# Patient Record
Sex: Male | Born: 1980 | Race: Black or African American | Hispanic: No | Marital: Married | Smoking: Never smoker
Health system: Southern US, Community
[De-identification: ages and names within clinical notes are randomized; demographics above are authoritative.]

## PROBLEM LIST (undated history)

## (undated) DIAGNOSIS — E119 Type 2 diabetes mellitus without complications: Secondary | ICD-10-CM

## (undated) DIAGNOSIS — G114 Hereditary spastic paraplegia: Secondary | ICD-10-CM

## (undated) HISTORY — DX: Type 2 diabetes mellitus without complications: E11.9

## (undated) HISTORY — PX: CARDIAC SURGERY: SHX584

---

## 2018-07-16 ENCOUNTER — Other Ambulatory Visit: Payer: Self-pay | Admitting: Internal Medicine

## 2018-07-16 DIAGNOSIS — R270 Ataxia, unspecified: Secondary | ICD-10-CM

## 2018-07-24 ENCOUNTER — Ambulatory Visit
Admission: RE | Admit: 2018-07-24 | Discharge: 2018-07-24 | Disposition: A | Discharge status: Home / Self Care | Payer: BLUE CROSS/BLUE SHIELD | Source: Ambulatory Visit | Attending: Internal Medicine | Admitting: Internal Medicine

## 2018-07-24 ENCOUNTER — Other Ambulatory Visit: Payer: Self-pay

## 2018-07-24 DIAGNOSIS — R270 Ataxia, unspecified: Secondary | ICD-10-CM | POA: Insufficient documentation

## 2018-07-24 MED ORDER — GADOBUTROL 1 MMOL/ML IV SOLN
10.0000 mL | Freq: Once | INTRAVENOUS | Status: AC | PRN
  Administered 2018-07-24: 10 mL via INTRAVENOUS

## 2019-01-27 ENCOUNTER — Ambulatory Visit: Payer: BLUE CROSS/BLUE SHIELD | Attending: Neurology

## 2019-01-27 ENCOUNTER — Other Ambulatory Visit: Payer: Self-pay

## 2019-01-27 DIAGNOSIS — G8929 Other chronic pain: Secondary | ICD-10-CM | POA: Diagnosis present

## 2019-01-27 DIAGNOSIS — R2681 Unsteadiness on feet: Secondary | ICD-10-CM | POA: Insufficient documentation

## 2019-01-27 DIAGNOSIS — M25511 Pain in right shoulder: Secondary | ICD-10-CM | POA: Insufficient documentation

## 2019-01-27 NOTE — Therapy (Addendum)
[place] [facility] MAIN [facility] SERVICES [address] [place], [place], [address] Phone: [phone]   Fax:  [phone]   Physical Therapy Evaluation  Patient Details  Name: Danny Cooper MRN: [id number] Date of Birth: 03/25/1981 Referring Provider (PT): Dr. Sherryll Burger   Encounter Date: 01/27/2019  PT End of Session - 01/28/19 0819    Visit Number  1    Number of Visits  25    Date for PT Re-Evaluation  04/22/19    Authorization Type  eval 01/28/19    PT Start Time  1015    PT Stop Time  1115    PT Time Calculation (min)  60 min    Equipment Utilized During Treatment  Gait belt    Activity Tolerance  Patient tolerated treatment well    Behavior During Therapy  [facility] for tasks assessed/performed       History reviewed. No pertinent past medical history.  History reviewed. No pertinent surgical history.  There were no vitals filed for this visit.   Subjective Assessment - 01/27/19 1024    Subjective  Imbalance, ataxia, R shoulder pain, and L flank pain    Pertinent History  Pt is here for imbalance, R shoulder pain, and L flank pain. 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 He has been having imbalance for multiple years but has never had any therapy for this issue. He ambulates using a cane or walking stick in his RUE. He also has a rolling walker but doesn't use it. He endorses 2-3 falls in the last 6 months which tipically occur laterally or as a result of tripping over his feet. In addition he also has been having R shoulder pain for the last 2-3 months. He believes that the pain might have started after he fell on his R shoulder but he is unsure. He is also complaining of L flank pain today but this was not discussed in depth. He was recently started on Chantix for the ataxia. Pt reports that he also has a history of sleep apnea. Denies red flag symptoms.    Limitations  Walking    How long can you  walk comfortably?  No limitations    Diagnostic tests  MRI, see history in evaluation    Patient Stated Goals  Improve balance, relieve L rib pain, decrease numbness/tingling in legs, improve quality of life    Currently in Pain?  No/denies    Pain Score  0-No pain   Best: 0/10, Worst: 5/10   Pain Location  Shoulder    Pain Orientation  Right    Pain Descriptors / Indicators  Tightness;Aching    Pain Type  Chronic pain    Pain Radiating Towards  None, pain is on superior aspect of R shoulder    Pain Onset  More than a month ago    Pain Frequency  Intermittent    Aggravating Factors   lifting son, no problems with reaching overhead    Pain Relieving Factors  assage, Aleve possibly, has not tried ice or head    Multiple Pain Sites  --   Complains of L flank pain but not discussed in depth today         SUBJECTIVE Onset: Pt is here for imbalance, R shoulder pain, and L flank pain. 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 He has been having imbalance for multiple years but has never had any therapy for this  issue. He ambulates using a cane or walking stick in his RUE. He also has a rolling walker but doesn't use it. He endorses 2-3 falls in the last 6 months which tipically occur laterally or as a result of tripping over his feet. In addition he also has been having R shoulder pain for the last 2-3 months. He believes that the pain might have started after he fell on his R shoulder but he is unsure. He is also complaining of L flank pain today but this was not discussed in depth. He was recently started on Chantix for the ataxia. Pt reports that he also has a history of sleep apnea. Denies red flag symptoms. Shoulder trauma: Yes Pt is unsure but thinks he may have fallen on the shoulder.  Pain quality: Aching and tightness Pain: 0/10 Present, 0/10 Best, 5/10 Worst: Aggravating factors: lifting son, no problems with reaching  overhead Easing factors: Massage, Aleve possibly, has not tried ice or head 24 hour pain behavior: More in the AM to noon, improves with movement Radiating pain: No Numbness/Tingling: Yes, no numbness/tingling in RUE, Hx of bilateral anterior thigh and leg numbness. Tingling in plantar surface of both feet Prior history of shoulder injury or pain: No Prior history of therapy for shoulder: No Dominant hand: Right Imaging: No history of R shoulder imaging. Brain MRI 07/24/18: No focal or acute finding. The brain does show generalized volume loss, more than expected at this age. Recent changes in overall health/medication: Pt just started Chantix last month for the ataxia. Was given neurological diagnosis of ARSACS in April per patient report.  Falls: 2-3 falls in the last 6 months. Directional pattern for falls: Sideways, Pt reports that he tends to trip over his feet Prior history of physical therapy: no history of therapy for imbalance or R shoulder pain Follow-up appointment with MD: PCP December 3rd, 2020.  Red flags (bowel/bladder changes, saddle paresthesia, personal history of cancer, chills/fever, night sweats, unrelenting pain) Negative   OBJECTIVE  MUSCULOSKELETAL: Tremor: Absent Bulk: Normal Tone: Normal, no clonus  Posture Rounded shoulders and forward head  Gait Ataxic and unsteady gait with walking stick in RUE. Wide BOS and frequent listing.   Strength R/L 4+/4+ Shoulder flexion (anterior deltoid/pec major/coracobrachialis, axillary n. (C5-6) and musculocutaneous n. (C5-7)) 5/5 Shoulder abduction (deltoid/supraspinatus, axillary/suprascapular n, C5) 4/4 Shoulder external rotation (infraspinatus/teres minor) 5/5 Shoulder internal rotation (subcapularis/lats/pec major) 5/5 Elbow flexion (biceps brachii, brachialis, brachioradialis, musculoskeletal n, C5-6) 5/5 Elbow extension (triceps, radial n, C7) 5/5 Wrist Extension 5/5 Wrist Flexion 5/5 Finger adduction  (interossei, ulnar n, T1) 4-/4- Hip flexion 4-/4- Hip external rotation 4-/4- Hip internal rotation 4-/4- Hip abduction (tested in sittng) 4-/4- Hip adduction (tested in sitting) 5/5 Knee extension 4-/4 Knee flexion 3/3 Ankle Plantarflexion 4/4 Ankle Dorsiflexion   NEUROLOGICAL:  Mental Status Patient is oriented to person, place and time.  Recent memory is intact.  Remote memory is intact.  Attention span and concentration are intact.  Expressive speech is intact.  Patient's fund of knowledge is within normal limits for educational level.  Cranial Nerves Visual acuity and visual fields are intact  Severely limited vertical gaze bilaterally. Horizontal smooth pursuits are extremely saccadic Facial sensation is intact bilaterally  Hearing is normal as tested by gross conversation Shoulder shrug strength is intact   Sensation Grossly intact to light touch bilateral UEs/LEs as determined by testing dermatomes C2-T2/L2-S2 respectively Proprioception and hot/cold testing deferred on this date  Reflexes Deferred testing  Coordination/Cerebellar Finger to Nose:  Abnormal bilaterally Heel to Shin: Abnormal bilaterally Rapid alternating movements: Abnormal bilaterally Finger Opposition: WNL Pronator Drift: Negative Finger to chin: Abnormal bilaterally   FUNCTIONAL OUTCOME MEASURES   Results Comments  QuickDASH 20.5% R shoulder  BERG 44/56 Fall risk, in need of intervention  DGI 14/24 Fall risk, in need of intervention  TUG 11.7 seconds WNL  5TSTS 14.8 seconds Above cutoff  10 Meter Gait Speed Self-selected: 11.4s = 0.88 m/s; Fastest: 8.0 s =  1.25 m/s Within acceptable limits for fastest speed  ABC Scale 64.4 % Fall risk, in need of intervention       [facility] - 01/27/19 1106      Assessment   Medical Diagnosis  Ataxia    Referring Provider (PT)  Dr. Sherryll BurgerShah    Onset Date/Surgical Date  --   Multiple years for balance, shoulder x 2-3 months   Hand  Dominance  Right    Next MD Visit  PCP April 01, 2019    Prior Therapy  None      Precautions   Precautions  Fall      Restrictions   Weight Bearing Restrictions  No      Balance Screen   Has the patient fallen in the past 6 months  Yes    How many times?  2-3    Has the patient had a decrease in activity level because of a fear of falling?   Yes    Is the patient reluctant to leave their home because of a fear of falling?   Yes      Home Environment   Living Environment  Private residence    Living Arrangements  Spouse/significant other;Children    Available Help at Discharge  Family    Type of Home  House    Home Access  Stairs to enter    Entrance Stairs-Number of Steps  4    Entrance Stairs-Rails  Can reach both    Home Layout  One level    Home Equipment  [place] - single point;Walker - 4 wheels      Prior Function   Level of Independence  Independent    Vocation  Part time employment    Scientist, water qualityVocation Requirements  Substitute teacher      Cognition   Overall Cognitive Status  Within Functional Limits for tasks assessed      Observation/Other Assessments   Other Surveys   Activities of Balance and Confidence Scale;Quick Dash    Activities of Balance Confidence Scale (ABC Scale)   64.4%    Quick DASH   20.5%      Standardized Balance Assessment   Standardized Balance Assessment  Berg Balance Test;Dynamic Gait Index      Berg Balance Test   Sit to Stand  Able to stand without using hands and stabilize independently    Standing Unsupported  Able to stand safely 2 minutes    Sitting with Back Unsupported but Feet Supported on Floor or Stool  Able to sit safely and securely 2 minutes    Stand to Sit  Sits safely with minimal use of hands    Transfers  Able to transfer safely, minor use of hands    Standing Unsupported with Eyes Closed  Able to stand 10 seconds with supervision    Standing Unsupported with Feet Together  Able to place feet together independently and stand  for 1 minute with supervision    From Standing, Reach Forward with Outstretched Arm  Can  reach forward >12 cm safely (5")    From Standing Position, Pick up Object from Floor  Able to pick up shoe, needs supervision    From Standing Position, Turn to Look Behind Over each Shoulder  Looks behind from both sides and weight shifts well    Turn 360 Degrees  Able to turn 360 degrees safely but slowly    Standing Unsupported, Alternately Place Feet on Step/Stool  Able to complete 4 steps without aid or supervision    Standing Unsupported, One Foot in Front  Able to plae foot ahead of the other independently and hold 30 seconds    Standing on One Leg  Tries to lift leg/unable to hold 3 seconds but remains standing independently    Total Score  44      Dynamic Gait Index   Level Surface  Mild Impairment    Change in Gait Speed  Mild Impairment    Gait with Horizontal Head Turns  Moderate Impairment    Gait with Vertical Head Turns  Mild Impairment    Gait and Pivot Turn  Mild Impairment    Step Over Obstacle  Moderate Impairment    Step Around Obstacles  Normal    Steps  Moderate Impairment    Total Score  14                Objective measurements completed on examination: See above findings.              PT Education - 01/28/19 0819    Education Details  Plan of care    Person(s) Educated  Patient    Methods  Explanation    Comprehension  Verbalized understanding       PT Short Term Goals - 01/28/19 0827      PT SHORT TERM GOAL #1   Title  Pt will be independent with HEP in order to improve strength and balance as well as strengthen R shoulder in order to decrease fall risk and improve pain-free function at home and work.    Time  6    Period  Weeks    Status  New    Target Date  03/10/19        PT Long Term Goals - 01/28/19 0827      PT LONG TERM GOAL #1   Title  Pt will improve BERG by at least 3 points in order to demonstrate clinically significant  improvement in balance.    Baseline  01/27/19: 44/56    Time  12    Period  Weeks    Status  New    Target Date  04/21/19      PT LONG TERM GOAL #2   Title  Pt will improve DGI by at least 3 points in order to demonstrate clinically significant improvement in balance and decreased risk for falls.    Baseline  01/27/19: 14/24    Time  12    Period  Weeks    Target Date  04/21/19      PT LONG TERM GOAL #3   Title  Pt will improve ABC by at least 13% in order to demonstrate clinically significant improvement in balance confidence.    Baseline  01/27/19: 64.4%    Time  12    Period  Weeks    Status  New    Target Date  04/21/19      PT LONG TERM GOAL #4   Title  Pt will decrease 5TSTS  by at least 3 seconds in order to demonstrate clinically significant improvement in LE strength.    Baseline  14.8 seconds    Time  12    Period  Weeks    Status  New    Target Date  04/21/19      PT LONG TERM GOAL #5   Title  Pt will decrease quick DASH score by at least 8% in order to demonstrate clinically significant reduction in disability.    Baseline  01/27/19: 20.5%    Time  12    Period  Weeks    Status  New    Target Date  04/21/19      Additional Long Term Goals   Additional Long Term Goals  Yes      PT LONG TERM GOAL #6   Title  Pt will decrease worst R shoulder pain as reported on NPRS by at least 3 points in order to demonstrate clinically significant reduction in pain.    Baseline  01/27/19: 5/10    Time  12    Period  Weeks    Status  New    Target Date  04/21/19             Plan - 01/28/19 1610    Clinical Impression Statement  Pt is a pleasant 37 year-old male referred for ataxia, R shoulder pain, and L flank pain. 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 He has been having imbalance for multiple years but has never had any therapy for this issue. PT examination reveals deficits in balance with need for walking  stick for ambulation. BERG is 44/56 and DGI is 14/24. 5TSTS demonstrates weakness in BLE however TUG and 50m gait speed are grossly WFL. QuickDASH of 20.5% indicates significant R shoulder concerns but not severe disability related to this issue. Unable to examine R shoulder pain or L flank pain during evaluation however will address in future visits. Pt presents with deficits in strength, gait , balance, R shoulder pain, and L flank pain. He will benefit from skilled PT services to address these issues in order to decrease risk for future falls and resume full pain-free function at home and work.    Personal Factors and Comorbidities  Comorbidity 1;Fitness;Time since onset of injury/illness/exacerbation    Comorbidities  Depression    Examination-Activity Limitations  Carry;Lift;Stairs;Caring for Others    Examination-Participation Restrictions  Community Activity;Shop;Yard Work    Conservation officer, historic buildings  Evolving/Moderate complexity    Clinical Decision Making  Moderate    Rehab Potential  Fair    PT Frequency  2x / week    PT Duration  12 weeks    PT Treatment/Interventions  ADLs/Self Care Home Management;Aquatic Therapy;Canalith Repostioning;Cryotherapy;Electrical Stimulation;Iontophoresis /ml Dexamethasone;Moist Heat;Traction;Ultrasound;DME Instruction;Gait training;Stair training;Functional mobility training;Therapeutic activities;Therapeutic exercise;Balance training;Neuromuscular re-education;Patient/family education;Manual techniques;Dry needling;Vestibular;Spinal Manipulations;Joint Manipulations    PT Next Visit Plan  , assess R shoulder pain and if time L flank pain, initiate HEP    PT Home Exercise Plan  None currently       Patient will benefit from skilled therapeutic intervention in order to improve the following deficits and impairments:  Abnormal gait, Decreased balance, Decreased strength, Difficulty walking, Pain  Visit Diagnosis: Unsteadiness on  feet  Chronic right shoulder pain     Problem List There are no active problems to display for this patient.  Sharalyn Ink Laci Frenkel PT, DPT, GCS  Promise Bushong 01/28/2019, 8:36 AM  [place] [facility] MAIN [facility] SERVICES [address] [place], [place], [address] Phone: [phone]   Fax:  [phone]  Name: Danny Cooper MRN: [id number] Date of Birth: 1980-11-13

## 2019-01-28 NOTE — Addendum Note (Signed)
Addended by: Roxana Hires D on: 01/28/2019 08:37 AM   Modules accepted: Orders

## 2019-02-01 ENCOUNTER — Ambulatory Visit: Payer: BLUE CROSS/BLUE SHIELD | Attending: Neurology

## 2019-02-01 ENCOUNTER — Other Ambulatory Visit: Payer: Self-pay

## 2019-02-01 VITALS — BP 112/63 | HR 89

## 2019-02-01 DIAGNOSIS — M25511 Pain in right shoulder: Secondary | ICD-10-CM | POA: Insufficient documentation

## 2019-02-01 DIAGNOSIS — R2681 Unsteadiness on feet: Secondary | ICD-10-CM | POA: Diagnosis present

## 2019-02-01 DIAGNOSIS — G8929 Other chronic pain: Secondary | ICD-10-CM | POA: Insufficient documentation

## 2019-02-01 NOTE — Therapy (Signed)
[place] [facility] MAIN [facility] SERVICES [address] [place], [place], [address] Phone: [phone]   Fax:  [phone]  Physical Therapy Treatment  Patient Details  Name: Danny Cooper MRN: [id number] Date of Birth: 08-14-1980 Referring Provider (PT): Dr. Sherryll Burger   Encounter Date: 02/01/2019  PT End of Session - 02/01/19 1526    Visit Number  2    Number of Visits  25    Date for PT Re-Evaluation  04/22/19    Authorization Type  eval 01/28/19    PT Start Time  1522    PT Stop Time  1616    PT Time Calculation (min)  54 min    Equipment Utilized During Treatment  Gait belt    Activity Tolerance  Patient tolerated treatment well    Behavior During Therapy  [facility] for tasks assessed/performed       History reviewed. No pertinent past medical history.  History reviewed. No pertinent surgical history.  Vitals:   02/01/19 1527  BP: 112/63  Pulse: 89  SpO2: 99%    Subjective Assessment - 02/01/19 1525    Subjective  Pt reports that he is doing well today. He states that he was started on vitamin D but otherwise no changes in health or medications since initial evaluation. No specific questions or concerns at htis time.    Pertinent History  Pt is here for imbalance, R shoulder pain, and L flank pain. 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 He has been having imbalance for multiple years but has never had any therapy for this issue. He ambulates using a cane or walking stick in his RUE. He also has a rolling walker but doesn't use it. He endorses 2-3 falls in the last 6 months which tipically occur laterally or as a result of tripping over his feet. In addition he also has been having R shoulder pain for the last 2-3 months. He believes that the pain might have started after he fell on his R shoulder but he is unsure. He is also complaining of L flank pain today but this was not discussed in depth. He was  recently started on Chantix for the ataxia. Pt reports that he also has a history of sleep apnea. Denies red flag symptoms.    Limitations  Walking    How long can you walk comfortably?  No limitations    Diagnostic tests  MRI, see history in evaluation    Patient Stated Goals  Improve balance, relieve L rib pain, decrease numbness/tingling in legs, improve quality of life    Currently in Pain?  No/denies         [facility] PT Assessment - 02/01/19 1528      6 Minute Walk- Baseline   6 Minute Walk- Baseline  yes    BP (mmHg)  112/63    HR (bpm)  59    02 Sat (%RA)  99 %      6 Minute walk- Post Test   6 Minute Walk Post Test  yes    BP (mmHg)  119/66    HR (bpm)  138    02 Sat (%RA)  97 %    Modified Borg Scale for Dyspnea  3- Moderate shortness of breath or breathing difficulty    Perceived Rate of Exertion (Borg)  12-      6 minute walk test results    Aerobic Endurance Distance Walked  1170    Endurance  additional comments  with single point cane in RUE         TREATMENT  Ther-ex  performed with pt who ambulated 1170' with single point cane in RUE, CGA throughout with one episode of modA+1 with pt stumbling and falling into wall; Shoulder examination performed (see below):    SUBJECTIVE Pt has been having R shoulder pain for the last 2-3 months. He believes that the pain might have started after he fell on his R shoulder but he is unsure. Marland Kitchen He was recently started on Chantix for the ataxia. Pt reports that he also has a history of sleep apnea. Denies red flag symptoms. Shoulder trauma: Yes Pt is unsure but thinks he may have fallen on the shoulder.  Pain quality: Aching and tightness on top side of shoulder Pain: 0/10 Present, 0/10 Best, 5/10 Worst Aggravating factors: lifting son, reaching for seat belt across body, no problems with reaching overhead Easing factors: Massage, Aleve possibly, has not tried ice or head 24 hour pain behavior: More in the AM to noon,  improves with movement Radiating pain: No Numbness/Tingling: Yes, no numbness/tingling in RUE, no focal weakness noted Prior history of shoulder injury or pain: No Prior history of therapy for shoulder: No Dominant hand: Right Imaging: No history of R shoulder imaging.  Follow-up appointment with MD: PCP December 3rd, 2020.  Red flags (bowel/bladder changes, saddle paresthesia, personal history of cancer, chills/fever, night sweats, unrelenting pain) Negative  OBJECTIVE  MUSCULOSKELETAL: Tremor: Normal Bulk: Normal Tone: Normal  Cervical Screen AROM: WFL but R shoulder pain reported with overpressure into flexion Spurlings A (ipsilateral lateral flexion/axial compression): R: Not done L: Not done Spurlings B (ipsilateral lateral flexion/contralateral rotation/axial compression): R: Negative L: Negative Repeated movement: No centralization or peripheralization with protraction or retraction Hoffman Sign (cervical cord compression): R: Negative L: Negative ULTT Median: R: Negative L: Not examined ULTT Ulnar: R: Negative L: Not examined ULTT Radial: R: Negative L: Not examined  Elbow Screen Elbow AROM: Within Normal Limits  Palpation No pain with palpation to anterior, posterior, lateral, and superior shoulder  Strength R/L 5/5 Shoulder flexion (anterior deltoid/pec major/coracobrachialis, axillary n. (C5-6) and musculocutaneous n. (C5-7)) 4+/5 Shoulder abduction (deltoid/supraspinatus, axillary/suprascapular n, C5) 5/5 Shoulder external rotation (infraspinatus/teres minor) 4/4 Shoulder internal rotation (subcapularis/lats/pec major) 4/4 Shoulder extension (posterior deltoid, lats, teres major, axillary/thoracodorsal n.) 4/4 Shoulder horizontal abduction 3+/3+ Low trap 5/5 Elbow flexion (biceps brachii, brachialis, brachioradialis, musculoskeletal n, C5-6) 5/5 Elbow extension (triceps, radial n, C7) 5/5 Wrist Extension 5/5 Wrist Flexion 5/5 Finger adduction (interossei,  ulnar n, T1), with the exception of significant weakness in 5th finger adduction bilaterally  AROM R/L 164*/164 Shoulder flexion 159*/159 Shoulder abduction 85/85 Shoulder external rotation 65/65 Shoulder internal rotation *Indicates pain, overpressure performed unless otherwise indicated Functionally HBH is symmetrical and pain-free bilaterally, HBB is pain-free but more limited on LUE with approximately 3 inch difference;   Accessory Motions/Glides Glenohumeral: Posterior: R: abnormal painful L: not examined Inferior: R: normal L: not examined Anterior: R: not examined L: not examined  Acromioclavicular:  Posterior: R: abnormal painful L: not examined Anterior: R: abnormal painful L: not examined  Sternoclavicular: Posterior: R: not examined L: not examined Anterior: R: not examined L: not examined Superior: R: not examined L: not examined Inferior: R: normal L: normal  Scapulothoracic: Distraction: R: not examined L: not examined Medial: R: not examined L: not examined Lateral: R: not examined L: not examined Inferior: R: not examined L: not examined Superior: R: not examined  L: not examined  NEUROLOGICAL:  Mental Status Patient is oriented to person, place and time.  Recent memory is intact.  Remote memory is intact.  Attention span and concentration are intact.  Expressive speech is intact.  Patient's fund of knowledge is within normal limits for educational level.  Sensation Grossly intact to light touch bilateral UE as determined by testing dermatomes C2-T2 during original evaluation Proprioception and hot/cold testing deferred on this date   SPECIAL TESTS  Rotator Cuff  Drop Arm Test: Negative Painful Arc (Pain from 60 to 120 degrees scaption): Negative Infraspinatus Muscle Test: Negative If all 3 tests positive, the probability of a full-thickness rotator cuff tear is 91%  Subacromial Impingement (R side) Hawkins-Kennedy: Positive Neer (Block  scapula, PROM flexion): Positive Painful Arc (Pain from 60 to 120 degrees scaption): Negative Empty Can: Positive External Rotation Resistance: Negative Horizontal Adduction: Positive Scapular Assist: Not examined  Labral Tear Biceps Load II (120 elevation, full ER, 90 elbow flexion, full supination, resisted elbow flexion): Not examined Crank (160 scaption, axial load with IR/ER): Not examined Active Compression Test: Not examined  Bicep Tendon Pathology Speed (shoulder flexion to 90, external rotation, full elbow extension, and forearm supination with resistance: Positive Yergason's (resisted shoulder ER and supination/biceps tendon pathology): Positive  Shoulder Instability Sulcus Sign: Negative Anterior Apprehension: Negative   Pt was able to ambulate 1170' today during 6 Minute Walk Test which is below normative values for age/gender. Pt also experiences one loss of balance during test requiring moderate assist from therapist to prevent him from falling. Performed shoulder evaluation with patient and he presents with end range paing with overpressure into flexion and abduction. He has some functional limitation of internal rotation range of motion. Global weakness of both shoulder and periscapular muscles. No pain to palpation but he does report some pain with posterior glide of the glenohumeral joint as well as anterior/posterior mobilization of the [facility]. He has some positive testing for SAIS and bicept tendon pathology. Will initiate HEP for R shoulder as well as balance at next therapy session.        PT Short Term Goals - 01/28/19 0827      PT SHORT TERM GOAL #1   Title  Pt will be independent with HEP in order to improve strength and balance as well as strengthen R shoulder in order to decrease fall risk and improve pain-free function at home and work.    Time  6    Period  Weeks    Status  New    Target Date  03/10/19        PT Long Term Goals - 01/28/19 0827       PT LONG TERM GOAL #1   Title  Pt will improve BERG by at least 3 points in order to demonstrate clinically significant improvement in balance.    Baseline  01/27/19: 44/56    Time  12    Period  Weeks    Status  New    Target Date  04/21/19      PT LONG TERM GOAL #2   Title  Pt will improve DGI by at least 3 points in order to demonstrate clinically significant improvement in balance and decreased risk for falls.    Baseline  01/27/19: 14/24    Time  12    Period  Weeks    Target Date  04/21/19      PT LONG TERM GOAL #3   Title  Pt will improve  ABC by at least 13% in order to demonstrate clinically significant improvement in balance confidence.    Baseline  01/27/19: 64.4%    Time  12    Period  Weeks    Status  New    Target Date  04/21/19      PT LONG TERM GOAL #4   Title  Pt will decrease 5TSTS by at least 3 seconds in order to demonstrate clinically significant improvement in LE strength.    Baseline  14.8 seconds    Time  12    Period  Weeks    Status  New    Target Date  04/21/19      PT LONG TERM GOAL #5   Title  Pt will decrease quick DASH score by at least 8% in order to demonstrate clinically significant reduction in disability.    Baseline  01/27/19: 20.5%    Time  12    Period  Weeks    Status  New    Target Date  04/21/19      Additional Long Term Goals   Additional Long Term Goals  Yes      PT LONG TERM GOAL #6   Title  Pt will decrease worst R shoulder pain as reported on NPRS by at least 3 points in order to demonstrate clinically significant reduction in pain.    Baseline  01/27/19: 5/10    Time  12    Period  Weeks    Status  New    Target Date  04/21/19            Plan - 02/01/19 1526    Clinical Impression Statement  Pt was able to ambulate 1170' today during 6 Minute Walk Test which is below normative values for age/gender. Pt also experiences one loss of balance during test requiring moderate assist from therapist to prevent him from  falling. Performed shoulder evaluation with patient and he presents with end range paing with overpressure into flexion and abduction. He has some functional limitation of internal rotation range of motion. Global weakness of both shoulder and periscapular muscles. No pain to palpation but he does report some pain with posterior glide of the glenohumeral joint as well as anterior/posterior mobilization of the [facility] joint. He has some positive testing for SAIS and bicept tendon pathology. Will initiate HEP for R shoulder as well as balance at next therapy session.    Personal Factors and Comorbidities  Comorbidity 1;Fitness;Time since onset of injury/illness/exacerbation    Comorbidities  Depression    Examination-Activity Limitations  Carry;Lift;Stairs;Caring for Others    Examination-Participation Restrictions  Community Activity;Shop;Yard Work    Journalist, newspaper    Rehab Potential  Fair    PT Frequency  2x / week    PT Duration  12 weeks    PT Treatment/Interventions  ADLs/Self Care Home Management;Aquatic Therapy;Canalith Repostioning;Cryotherapy;Electrical Stimulation;Iontophoresis 4mg /ml Dexamethasone;Moist Heat;Traction;Ultrasound;DME Instruction;Gait training;Stair training;Functional mobility training;Therapeutic activities;Therapeutic exercise;Balance training;Neuromuscular re-education;Patient/family education;Manual techniques;Dry needling;Vestibular;Spinal Manipulations;Joint Manipulations    PT Next Visit Plan  Issue HEP for R shoulder as well as for balance    PT Home Exercise Plan  None currently       Patient will benefit from skilled therapeutic intervention in order to improve the following deficits and impairments:  Abnormal gait, Decreased balance, Decreased strength, Difficulty walking, Pain  Visit Diagnosis: Unsteadiness on feet  Chronic right shoulder pain     Problem List There are no active problems to display for  this  patient.  Lynnea Maizes PT, DPT, GCS  Huprich,Jason 02/01/2019, 4:44 PM  [place] [facility] MAIN [facility] SERVICES [address] [place], [place], [address] Phone: [phone]   Fax:  [phone]  Name: Danny Cooper MRN: [id number] Date of Birth: 07-Mar-1981

## 2019-02-09 ENCOUNTER — Other Ambulatory Visit: Payer: Self-pay

## 2019-02-09 ENCOUNTER — Ambulatory Visit: Payer: BLUE CROSS/BLUE SHIELD

## 2019-02-09 DIAGNOSIS — R2681 Unsteadiness on feet: Secondary | ICD-10-CM | POA: Diagnosis not present

## 2019-02-09 DIAGNOSIS — M25511 Pain in right shoulder: Secondary | ICD-10-CM

## 2019-02-09 DIAGNOSIS — G8929 Other chronic pain: Secondary | ICD-10-CM

## 2019-02-09 NOTE — Therapy (Signed)
[place] [facility] [address] [place], [place], [address] Phone: [phone]   Fax:  [phone]  Physical Therapy Treatment  Patient Details  Name: Danny Cooper MRN: [id number] Date of Birth: 10/13/1980 Referring Provider (PT): Dr. Sherryll BurgerShah   Encounter Date: 02/09/2019  PT End of Session - 02/09/19 1650    Visit Number  3    Number of Visits  25    Date for PT Re-Evaluation  04/22/19    Authorization Type  eval 01/28/19    PT Start Time  1650    PT Stop Time  1730    PT Time Calculation (min)  40 min    Equipment Utilized During Treatment  Gait belt    Activity Tolerance  Patient tolerated treatment well    Behavior During Therapy  [facility] for tasks assessed/performed       History reviewed. No pertinent past medical history.  History reviewed. No pertinent surgical history.  There were no vitals filed for this visit.  Subjective Assessment - 02/09/19 1652    Subjective  Pt reports that he is doing well today. He reports that his R shoulder has been bothering him the last 2 days. No specific questions or concerns at htis time.    Pertinent History  Pt is here for imbalance, R shoulder pain, and L flank pain. 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 He has been having imbalance for multiple years but has never had any therapy for this issue. He ambulates using a cane or walking stick in his RUE. He also has a rolling walker but doesn't use it. He endorses 2-3 falls in the last 6 months which tipically occur laterally or as a result of tripping over his feet. In addition he also has been having R shoulder pain for the last 2-3 months. He believes that the pain might have started after he fell on his R shoulder but he is unsure. He is also complaining of L flank pain today but this was not discussed in depth. He was recently started on Chantix for the ataxia. Pt reports that he also  has a history of sleep apnea. Denies red flag symptoms.    Limitations  Walking    How long can you walk comfortably?  No limitations    Diagnostic tests  MRI, see history in evaluation    Patient Stated Goals  Improve balance, relieve L rib pain, decrease numbness/tingling in legs, improve quality of life    Currently in Pain?  Yes    Pain Score  3     Pain Location  Shoulder    Pain Orientation  Right    Pain Descriptors / Indicators  Sore    Pain Type  Chronic pain    Pain Onset  More than a month ago    Pain Frequency  Intermittent        Treatment  There-ex:  NuStep 5 min at level 0 during history   HEP was created and reviewed today:  Standing Row with Anchored Resistance with green tband 2x10 with 3s hold  Standing Single Arm Shoulder Abduction with Resistance with red tband 2x10 with 3s hold  Scapular Retraction with Resistance Advanced with green tband 2x10 with 3s hold  Sit to Stand without Arm Support - 10 reps - 2 sets - 2x daily - 7x weekly  Half Tandem Stance Balance with Head Rotation 3x30s with each foot; instructed to perform either  next to a counter or standing with is back to a corner  Single Leg Stance - 3x30s on each; instructed to perform either next to a counter or standing with is back to a corner    Neuromusuclar Re-ed:  semitandem stance 3x30s performed bilaterally without UE support  Standing marching with SUE support 2x10    Pt educated throughout session about proper posture and technique with exercises. Improved exercise technique, movement at target joints, use of target muscles after min to mod verbal, visual, tactile cues.   Pt demonstrates good motivation throughout today's session.  An HEP was created and reviewed today, and pt returns demonstrations and verbalizes understanding with each exercise.  He had significant difficulty with half tandem stance balance with horizontal head turns and single leg stance, experiencing  intermittent LOBs.  He was instructed to perform these at home either with his back to a corner on the wall or while standing at a counter in order to be able to prevent a fall in the event of a LOB.  He demonstrates good technique with his shoulder exercises, but demonstrates fatigue towards the end of each set.  Pt will continue to benefit from skilled PT services to address deficits in balance, strength, and pain in order to prevent future falls and to improve function at home.          PT Short Term Goals - 01/28/19 0827      PT SHORT TERM GOAL #1   Title  Pt will be independent with HEP in order to improve strength and balance as well as strengthen R shoulder in order to decrease fall risk and improve pain-free function at home and work.    Time  6    Period  Weeks    Status  New    Target Date  03/10/19        PT Long Term Goals - 01/28/19 0827      PT LONG TERM GOAL #1   Title  Pt will improve BERG by at least 3 points in order to demonstrate clinically significant improvement in balance.    Baseline  01/27/19: 44/56    Time  12    Period  Weeks    Status  New    Target Date  04/21/19      PT LONG TERM GOAL #2   Title  Pt will improve DGI by at least 3 points in order to demonstrate clinically significant improvement in balance and decreased risk for falls.    Baseline  01/27/19: 14/24    Time  12    Period  Weeks    Target Date  04/21/19      PT LONG TERM GOAL #3   Title  Pt will improve ABC by at least 13% in order to demonstrate clinically significant improvement in balance confidence.    Baseline  01/27/19: 64.4%    Time  12    Period  Weeks    Status  New    Target Date  04/21/19      PT LONG TERM GOAL #4   Title  Pt will decrease 5TSTS by at least 3 seconds in order to demonstrate clinically significant improvement in LE strength.    Baseline  14.8 seconds    Time  12    Period  Weeks    Status  New    Target Date  04/21/19      PT LONG TERM GOAL #5    Title  Pt will decrease quick DASH score by at least 8% in order to demonstrate clinically significant reduction in disability.    Baseline  01/27/19: 20.5%    Time  12    Period  Weeks    Status  New    Target Date  04/21/19      Additional Long Term Goals   Additional Long Term Goals  Yes      PT LONG TERM GOAL #6   Title  Pt will decrease worst R shoulder pain as reported on NPRS by at least 3 points in order to demonstrate clinically significant reduction in pain.    Baseline  01/27/19: 5/10    Time  12    Period  Weeks    Status  New    Target Date  04/21/19            Plan - 02/10/19 1214    Clinical Impression Statement  Pt demonstrates good motivation throughout today's session.  An HEP was created and reviewed today, and pt returns demonstrations and verbalizes understanding with each exercise.  He had significant difficulty with half tandem stance balance with horizontal head turns and single leg stance, experiencing intermittent LOBs.  He was instructed to perform these at home either with his back to a corner on the wall or while standing at a counter in order to be able to prevent a fall in the event of a LOB.  He demonstrates good technique with his shoulder exercises, but demonstrates fatigue towards the end of each set.  Pt will continue to benefit from skilled PT services to address deficits in balance, strength, and pain in order to prevent future falls and to improve function at home.    Personal Factors and Comorbidities  Comorbidity 1;Fitness;Time since onset of injury/illness/exacerbation    Comorbidities  Depression    Examination-Activity Limitations  Carry;Lift;Stairs;Caring for Others    Examination-Participation Restrictions  Community Activity;Shop;Yard Work    Journalist, newspaper    Rehab Potential  Fair    PT Frequency  2x / week    PT Duration  12 weeks    PT Treatment/Interventions  ADLs/Self Care Home  Management;Aquatic Therapy;Canalith Repostioning;Cryotherapy;Electrical Stimulation;Iontophoresis 4mg /ml Dexamethasone;Moist Heat;Traction;Ultrasound;DME Instruction;Gait training;Stair training;Functional mobility training;Therapeutic activities;Therapeutic exercise;Balance training;Neuromuscular re-education;Patient/family education;Manual techniques;Dry needling;Vestibular;Spinal Manipulations;Joint Manipulations    PT Next Visit Plan  progress balance and strength; R shoulder and balance    PT Home Exercise Plan  [email or web address]       Patient will benefit from skilled therapeutic intervention in order to improve the following deficits and impairments:  Abnormal gait, Decreased balance, Decreased strength, Difficulty walking, Pain  Visit Diagnosis: Unsteadiness on feet  Chronic right shoulder pain     Problem List There are no active problems to display for this patient.   This entire session was performed under direct supervision and direction of a licensed therapist/therapist assistant . I have personally read, edited and approve of the note as written.   Lutricia Horsfall, SPT Phillips Grout PT, DPT, GCS  Huprich,Jason 02/10/2019, 3:11 PM  [place] MAIN [facility] SERVICES [address] [place], [place], [address] Phone: [phone]   Fax:  [phone]  Name: Danny Cooper MRN: [id number] Date of Birth: 1981/02/28

## 2019-02-09 NOTE — Patient Instructions (Signed)
Access Code: [email or web address]  URL: [email or web address]  Date: 02/09/2019  Prepared by: Roxana Hires   Exercises Standing Row with Anchored Resistance - 10 reps - 2 sets - 3s hold - 2x daily - 7x weekly Standing Single Arm Shoulder Abduction with Resistance - 10 reps - 2 sets - 3s hold - 2x daily - 7x weekly Scapular Retraction with Resistance Advanced - 10 reps - 2 sets - 3s hold - 2x daily - 7x weekly Sit to Stand without Arm Support - 10 reps - 2 sets - 2x daily - 7x weekly Half Tandem Stance Balance with Head Rotation - 3 reps - 30s x 3 with each forward hold - 2x daily - 7x weekly Single Leg Stance - 3 reps - 30s x 3 on each leg hold - 2x daily - 7x weekly

## 2019-02-11 ENCOUNTER — Ambulatory Visit: Payer: BLUE CROSS/BLUE SHIELD

## 2019-02-11 ENCOUNTER — Other Ambulatory Visit: Payer: Self-pay

## 2019-02-11 DIAGNOSIS — G8929 Other chronic pain: Secondary | ICD-10-CM

## 2019-02-11 DIAGNOSIS — M25511 Pain in right shoulder: Secondary | ICD-10-CM

## 2019-02-11 DIAGNOSIS — R2681 Unsteadiness on feet: Secondary | ICD-10-CM

## 2019-02-11 NOTE — Therapy (Signed)
[place] [facility] MAIN [facility] SERVICES [address] [place], [place], [address] Phone: [phone]   Fax:  [phone]  Physical Therapy Treatment  Patient Details  Name: Danny Cooper MRN: [id number] Date of Birth: Apr 10, 1981 Referring Provider (PT): Dr. Sherryll Burger   Encounter Date: 02/11/2019  PT End of Session - 02/11/19 0810    Visit Number  4    Number of Visits  25    Date for PT Re-Evaluation  04/22/19    Authorization Type  eval 01/28/19    PT Start Time  0808    PT Stop Time  0850    PT Time Calculation (min)  42 min    Equipment Utilized During Treatment  Gait belt    Activity Tolerance  Patient tolerated treatment well    Behavior During Therapy  [facility] for tasks assessed/performed       History reviewed. No pertinent past medical history.  History reviewed. No pertinent surgical history.  There were no vitals filed for this visit.  Subjective Assessment - 02/11/19 0809    Subjective  Pt reports that he is doing well today. He denies any shoulder pain today and states that it has not bothered him over the last couple days. He performed some of his HEP after the last session but not all of the exercises. No specific questions or concerns currently. No specific questions or concerns at htis time.    Pertinent History  Pt is here for imbalance, R shoulder pain, and L flank pain. 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 He has been having imbalance for multiple years but has never had any therapy for this issue. He ambulates using a cane or walking stick in his RUE. He also has a rolling walker but doesn't use it. He endorses 2-3 falls in the last 6 months which tipically occur laterally or as a result of tripping over his feet. In addition he also has been having R shoulder pain for the last 2-3 months. He believes that the pain might have started after he fell on his R shoulder but he is unsure.  He is also complaining of L flank pain today but this was not discussed in depth. He was recently started on Chantix for the ataxia. Pt reports that he also has a history of sleep apnea. Denies red flag symptoms.    Limitations  Walking    How long can you walk comfortably?  No limitations    Diagnostic tests  MRI, see history in evaluation    Patient Stated Goals  Improve balance, relieve L rib pain, decrease numbness/tingling in legs, improve quality of life    Currently in Pain?  No/denies         TREATMENT   Ther-ex Sit to Stand without UE support x 10, followed by Airex pad under feet x 10; 6" step-ups without UE support alternating LE x 10 on each side; Standing row with anchored resistance with green tband 2x10 with 3s hold Standing scapular retraction with anchored resistance with green tband 2x10 with 3s hold; Seated "W"s for scapular retraction with ER using green tband x 10 with 3s hold; Seated R shoulder abduction with green tband x10 with 3s hold; Pt requires min/modA+1 guarding during resisted standing shoulder exercises so converted HEP to seated exercises for strengthening, reviewed with patient, and issued new handout;   Neuromusuclar Re-ed: All exercises performed without UE support and CGA/minA+1 in // bars: Feet together (FT)  eyes closed (EC) x 30s; FT eyes open (EO) with horizontal and vertical head turns x 30s each; Semitandem EO balance alternating forward LE x 30s each; Full tandem EO balance alternating forward LE x 30s each; 6" step taps alternating LE x 10 on each side;   Pt educated throughout session about proper posture and technique with exercises. Improved exercise technique, movement at target joints, use of target muscles after min to mod verbal, visual, tactile cues.   Pt demonstrates good motivation throughout today's session.  Reviewed HEP exercises with patient during session today. He struggles significantly with balance with feet  together/modified tandem/tandem as well as any activities such as step taps which require single leg stance for any considerable amount of time. He also struggles considerably with eyes closed and when performing head turns. Pt was able to complete all upper quarter strengthening but demonstrated considerable instability in standing. He requires min/modA+1 guarding during resisted standing shoulder exercises so converted HEP to seated exercises for strengthening, reviewed with patient, and issued new handout. He requires intermittent seated rest breaks throughout session. Pt encouraged to continue HEP. Pt will continue to benefit from skilled PT services to address deficits in balance, strength, and pain in order to prevent future falls and to improve function at home.                         PT Short Term Goals - 01/28/19 0827      PT SHORT TERM GOAL #1   Title  Pt will be independent with HEP in order to improve strength and balance as well as strengthen R shoulder in order to decrease fall risk and improve pain-free function at home and work.    Time  6    Period  Weeks    Status  New    Target Date  03/10/19        PT Long Term Goals - 01/28/19 0827      PT LONG TERM GOAL #1   Title  Pt will improve BERG by at least 3 points in order to demonstrate clinically significant improvement in balance.    Baseline  01/27/19: 44/56    Time  12    Period  Weeks    Status  New    Target Date  04/21/19      PT LONG TERM GOAL #2   Title  Pt will improve DGI by at least 3 points in order to demonstrate clinically significant improvement in balance and decreased risk for falls.    Baseline  01/27/19: 14/24    Time  12    Period  Weeks    Target Date  04/21/19      PT LONG TERM GOAL #3   Title  Pt will improve ABC by at least 13% in order to demonstrate clinically significant improvement in balance confidence.    Baseline  01/27/19: 64.4%    Time  12    Period  Weeks     Status  New    Target Date  04/21/19      PT LONG TERM GOAL #4   Title  Pt will decrease 5TSTS by at least 3 seconds in order to demonstrate clinically significant improvement in LE strength.    Baseline  14.8 seconds    Time  12    Period  Weeks    Status  New    Target Date  04/21/19      PT  LONG TERM GOAL #5   Title  Pt will decrease quick DASH score by at least 8% in order to demonstrate clinically significant reduction in disability.    Baseline  01/27/19: 20.5%    Time  12    Period  Weeks    Status  New    Target Date  04/21/19      Additional Long Term Goals   Additional Long Term Goals  Yes      PT LONG TERM GOAL #6   Title  Pt will decrease worst R shoulder pain as reported on NPRS by at least 3 points in order to demonstrate clinically significant reduction in pain.    Baseline  01/27/19: 5/10    Time  12    Period  Weeks    Status  New    Target Date  04/21/19            Plan - 02/11/19 0820    Clinical Impression Statement  Pt demonstrates good motivation throughout today's session.  Reviewed HEP exercises with patient during session today. He struggles significantly with balance with feet together/modified tandem/tandem as well as any activities such as step taps which require single leg stance for any considerable amount of time. He also struggles considerably with eyes closed and when performing head turns. Pt was able to complete all upper quarter strengthening but demonstrated considerable instability in standing. He requires min/modA+1 guarding during resisted standing shoulder exercises so converted HEP to seated exercises for strengthening, reviewed with patient, and issued new handout. He requires intermittent seated rest breaks throughout session. Pt encouraged to continue HEP. Pt will continue to benefit from skilled PT services to address deficits in balance, strength, and pain in order to prevent future falls and to improve function at home.    Personal  Factors and Comorbidities  Comorbidity 1;Fitness;Time since onset of injury/illness/exacerbation    Comorbidities  Depression    Examination-Activity Limitations  Carry;Lift;Stairs;Caring for Others    Examination-Participation Restrictions  Community Activity;Shop;Yard Work    Journalist, newspaper    Rehab Potential  Fair    PT Frequency  2x / week    PT Duration  12 weeks    PT Treatment/Interventions  ADLs/Self Care Home Management;Aquatic Therapy;Canalith Repostioning;Cryotherapy;Electrical Stimulation;Iontophoresis 4mg /ml Dexamethasone;Moist Heat;Traction;Ultrasound;DME Instruction;Gait training;Stair training;Functional mobility training;Therapeutic activities;Therapeutic exercise;Balance training;Neuromuscular re-education;Patient/family education;Manual techniques;Dry needling;Vestibular;Spinal Manipulations;Joint Manipulations    PT Next Visit Plan  progress balance and strength; R shoulder and balance    PT Home Exercise Plan  [email or web address]       Patient will benefit from skilled therapeutic intervention in order to improve the following deficits and impairments:  Abnormal gait, Decreased balance, Decreased strength, Difficulty walking, Pain  Visit Diagnosis: Unsteadiness on feet  Chronic right shoulder pain     Problem List There are no active problems to display for this patient.  Phillips Grout PT, DPT, GCS  Tykesha Konicki 02/11/2019, 9:08 AM  [place] MAIN [facility] SERVICES [address] [place], [place], [address] Phone: [phone]   Fax:  [phone]  Name: Kylyn Sookram MRN: [id number] Date of Birth: Nov 03, 1980

## 2019-02-11 NOTE — Patient Instructions (Signed)
Access Code: [email or web address]  URL: [email or web address]  Date: 02/11/2019  Prepared by: Roxana Hires   Exercises Seated Shoulder Row with Anchored Resistance - 10 reps - 2 sets                   - 3s hold - 2x daily - 7x weekly Seated Scapular Retraction with External Rotation - 10 reps - 2 sets - 3s hold - 2x daily - 7x weekly Seated Shoulder Abduction with Resistance - 10 reps - 2 sets - 3s hold - 2x daily - 7x weekly Sit to Stand without Arm Support - 10 reps - 2 sets - 2x daily - 7x weekly Romberg Stance with Head Rotation - 3 reps - 30s hold - 2x daily - 7x weekly Single Leg Stance - 3 reps - 30s x 3 on each leg hold - 2x daily - 7x weekly

## 2019-02-16 ENCOUNTER — Other Ambulatory Visit: Payer: Self-pay

## 2019-02-16 ENCOUNTER — Ambulatory Visit: Payer: BLUE CROSS/BLUE SHIELD

## 2019-02-16 DIAGNOSIS — R2681 Unsteadiness on feet: Secondary | ICD-10-CM | POA: Diagnosis not present

## 2019-02-16 DIAGNOSIS — G8929 Other chronic pain: Secondary | ICD-10-CM

## 2019-02-16 DIAGNOSIS — M25511 Pain in right shoulder: Secondary | ICD-10-CM

## 2019-02-16 NOTE — Therapy (Signed)
[place] [facility] MAIN [facility] SERVICES [address] [place], [place], [address] Phone: [phone]   Fax:  [phone]  Physical Therapy Treatment  Patient Details  Name: Danny Cooper MRN: [id number] Date of Birth: [birth year] Referring Provider (PT): Dr. Sherryll Burger   Encounter Date: 02/16/2019  PT End of Session - 02/16/19 1719    Visit Number  5    Number of Visits  25    Date for PT Re-Evaluation  04/22/19    Authorization Type  eval 01/28/19    PT Start Time  1615    PT Stop Time  1700    PT Time Calculation (min)  45 min    Equipment Utilized During Treatment  Gait belt    Activity Tolerance  Patient tolerated treatment well    Behavior During Therapy  [facility] for tasks assessed/performed       History reviewed. No pertinent past medical history.  History reviewed. No pertinent surgical history.  There were no vitals filed for this visit.   Subjective Assessment - 02/16/19 1619    Subjective  Pt reports that he is doing well today. He denies any shoulder pain today.  He states that he was a little sore after last session, but that it went away quickly.  He does not endorse any falls since last visit.  No specific questions or concerns at this time.    Pertinent History  Pt is here for imbalance, R shoulder pain, and L flank pain. 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 He has been having imbalance for multiple years but has never had any therapy for this issue. He ambulates using a cane or walking stick in his RUE. He also has a rolling walker but doesn't use it. He endorses 2-3 falls in the last 6 months which tipically occur laterally or as a result of tripping over his feet. In addition he also has been having R shoulder pain for the last 2-3 months. He believes that the pain might have started after he fell on his R shoulder but he is unsure. He is also complaining of L flank pain today but this  was not discussed in depth. He was recently started on Chantix for the ataxia. Pt reports that he also has a history of sleep apnea. Denies red flag symptoms.    Limitations  Walking    How long can you walk comfortably?  No limitations    Diagnostic tests  MRI, see history in evaluation    Patient Stated Goals  Improve balance, relieve L rib pain, decrease numbness/tingling in legs, improve quality of life    Currently in Pain?  No/denies         TREATMENT   Ther-ex NuStep 5 min for warm-up L2 during history Standing scapular retraction with anchored resistancewith green tband 2x10 with 3s hold; Standing shoulder ER with red band 2x10 Standing  "W"s for scapular retraction with ER using green tband 2x10 with 3s hold; Seated R shoulder abduction with green tband2 x10 with 3s hold;   Seated R shoulder flexion with green band 2x10 Pt requires min/modA+1 guarding during resisted standing shoulder exercises so converted    Neuromusuclar Re-ed: All exercises performed with UE support and CGA/minA+1 in // bars:  Feet together eyes open (EO) with horizontal head turns 2x 45s each; Full tandem EO balance alternating forward LE 2x 30s each; Tandem gait in // bars x4 lengths forward with alternating BUE/SUE support throughout.  6" step-ups with UE support in // bars alternating LE 2x10 on each side;    Pt educated throughout session about proper posture and technique with exercises. Improved exercise technique, movement at target joints, use of target muscles after min to mod verbal, visual, tactile cues.   Pt demonstrates good motivation throughout today's session. He displays difficulty with tandem balance and narrow stance with horizontal head turns, requiring intermittent UE on // bars and CGA throughout.  He demonstrates unsteadiness with all balance exercises, requiring CGA throughout.  He requires minimal cueing on shoulder exercises, demonstrating good technique throughout.    Pt will continue to benefit from skilled PT services to address deficits in balance, strength, and pain in order to prevent future falls and to improve function at home.                         PT Short Term Goals - 01/28/19 0827      PT SHORT TERM GOAL #1   Title  Pt will be independent with HEP in order to improve strength and balance as well as strengthen R shoulder in order to decrease fall risk and improve pain-free function at home and work.    Time  6    Period  Weeks    Status  New    Target Date  03/10/19        PT Long Term Goals - 01/28/19 0827      PT LONG TERM GOAL #1   Title  Pt will improve BERG by at least 3 points in order to demonstrate clinically significant improvement in balance.    Baseline  01/27/19: 44/56    Time  12    Period  Weeks    Status  New    Target Date  04/21/19      PT LONG TERM GOAL #2   Title  Pt will improve DGI by at least 3 points in order to demonstrate clinically significant improvement in balance and decreased risk for falls.    Baseline  01/27/19: 14/24    Time  12    Period  Weeks    Target Date  04/21/19      PT LONG TERM GOAL #3   Title  Pt will improve ABC by at least 13% in order to demonstrate clinically significant improvement in balance confidence.    Baseline  01/27/19: 64.4%    Time  12    Period  Weeks    Status  New    Target Date  04/21/19      PT LONG TERM GOAL #4   Title  Pt will decrease 5TSTS by at least 3 seconds in order to demonstrate clinically significant improvement in LE strength.    Baseline  14.8 seconds    Time  12    Period  Weeks    Status  New    Target Date  04/21/19      PT LONG TERM GOAL #5   Title  Pt will decrease quick DASH score by at least 8% in order to demonstrate clinically significant reduction in disability.    Baseline  01/27/19: 20.5%    Time  12    Period  Weeks    Status  New    Target Date  04/21/19      Additional Long Term Goals   Additional Long  Term Goals  Yes      PT LONG TERM GOAL #6  Title  Pt will decrease worst R shoulder pain as reported on NPRS by at least 3 points in order to demonstrate clinically significant reduction in pain.    Baseline  01/27/19: 5/10    Time  12    Period  Weeks    Status  New    Target Date  04/21/19            Plan - 02/16/19 1718    Clinical Impression Statement  Pt demonstrates good motivation throughout today's session.  He displays difficulty with tandem balance and narrow stance with horizontal head turns, requiring intermittent UE on // bars and CGA throughout.  He demonstrates unsteadiness with all balance exercises, requiring CGA throughout.  He requires minimal cueing on shoulder exercises, demonstrating good technique throughout.    Pt will continue to benefit from skilled PT services to address deficits in balance, strength, and pain in order to prevent future falls and to improve function at home.    Personal Factors and Comorbidities  Comorbidity 1;Fitness;Time since onset of injury/illness/exacerbation    Comorbidities  Depression    Examination-Activity Limitations  Carry;Lift;Stairs;Caring for Others    Examination-Participation Restrictions  Community Activity;Shop;Yard Work    Journalist, newspaper    Rehab Potential  Fair    PT Frequency  2x / week    PT Duration  12 weeks    PT Treatment/Interventions  ADLs/Self Care Home Management;Aquatic Therapy;Canalith Repostioning;Cryotherapy;Electrical Stimulation;Iontophoresis 4mg /ml Dexamethasone;Moist Heat;Traction;Ultrasound;DME Instruction;Gait training;Stair training;Functional mobility training;Therapeutic activities;Therapeutic exercise;Balance training;Neuromuscular re-education;Patient/family education;Manual techniques;Dry needling;Vestibular;Spinal Manipulations;Joint Manipulations    PT Next Visit Plan  progress balance and strength; R shoulder and balance    PT Home Exercise Plan   [email or web address]       Patient will benefit from skilled therapeutic intervention in order to improve the following deficits and impairments:  Abnormal gait, Decreased balance, Decreased strength, Difficulty walking, Pain  Visit Diagnosis: Unsteadiness on feet  Chronic right shoulder pain     Problem List There are no active problems to display for this patient.  This entire session was performed under direct supervision and direction of a licensed therapist/therapist assistant . I have personally read, edited and approve of the note as written.   Lutricia Horsfall, SPT Phillips Grout PT, DPT, GCS  Huprich,Jason 02/17/2019, 2:03 PM  [place] MAIN [facility] SERVICES [address] [place], [place], [address] Phone: [phone]   Fax:  [phone]  Name: Unique Searfoss MRN: [id number] Date of Birth: [birth year]

## 2019-02-23 ENCOUNTER — Ambulatory Visit: Payer: BLUE CROSS/BLUE SHIELD

## 2019-02-23 ENCOUNTER — Other Ambulatory Visit: Payer: Self-pay

## 2019-02-23 DIAGNOSIS — M25511 Pain in right shoulder: Secondary | ICD-10-CM

## 2019-02-23 DIAGNOSIS — R2681 Unsteadiness on feet: Secondary | ICD-10-CM | POA: Diagnosis not present

## 2019-02-23 DIAGNOSIS — G8929 Other chronic pain: Secondary | ICD-10-CM

## 2019-02-23 NOTE — Therapy (Signed)
[place] MAIN [facility] SERVICES [address] [place], [place], [address] Phone: [phone]   Fax:  [phone]  Physical Therapy Treatment  Patient Details  Name: Danny Cooper MRN: [id number] Date of Birth: July 25, 1980 Referring Provider (PT): Dr. Manuella Ghazi   Encounter Date: 02/23/2019  PT End of Session - 02/23/19 1618    Visit Number  6    Number of Visits  25    Date for PT Re-Evaluation  04/22/19    Authorization Type  eval 01/28/19    PT Start Time  1615    PT Stop Time  1700    PT Time Calculation (min)  45 min    Equipment Utilized During Treatment  Gait belt    Activity Tolerance  Patient tolerated treatment well    Behavior During Therapy  [facility] for tasks assessed/performed       History reviewed. No pertinent past medical history.  History reviewed. No pertinent surgical history.  There were no vitals filed for this visit.  Subjective Assessment - 02/23/19 1621    Subjective  Pt reports that he is doing well today. He reports no pain or soreness today or after last session.  He does not endorse any falls since last visit.  He reports that his HEP is going well.  No specific questions or concerns at this time.    Pertinent History  Pt is here for imbalance, R shoulder pain, and L flank pain. 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 He has been having imbalance for multiple years but has never had any therapy for this issue. He ambulates using a cane or walking stick in his RUE. He also has a rolling walker but doesn't use it. He endorses 2-3 falls in the last 6 months which tipically occur laterally or as a result of tripping over his feet. In addition he also has been having R shoulder pain for the last 2-3 months. He believes that the pain might have started after he fell on his R shoulder but he is unsure. He is also complaining of L flank pain today but this was not discussed in  depth. He was recently started on Chantix for the ataxia. Pt reports that he also has a history of sleep apnea. Denies red flag symptoms.    Limitations  Walking    How long can you walk comfortably?  No limitations    Diagnostic tests  MRI, see history in evaluation    Patient Stated Goals  Improve balance, relieve L rib pain, decrease numbness/tingling in legs, improve quality of life    Currently in Pain?  No/denies          TREATMENT   Ther-ex NuStep 5 min for warm-up L3 during history Standingscapular retractionwith anchoredresistancewith green tband 2x15 with 3s hold; Standing shoulder ER with red band 2x12 Stand "Y's"  2x10 yellow Seated R shoulder abduction with green tband 2 x12 with 3s hold;   Seated R shoulder flexion with green band 2x12    Neuromusuclar Re-ed: All exercises performed with UE support and CGA/minA+1 in // bars:  Feet together eyes open (EO) with horizontal head turns 2x 45s each; Full tandem EO balance alternating forward LE 2x 45s each; Tandem gait in // bars x5 lengths forward with alternating BUE/SUE support throughout.    Pt educated throughout session about proper posture and technique with exercises. Improved exercise technique, movement at target joints, use of target muscles after min  to mod verbal, visual, tactile cues.   Pt demonstrates good motivation throughout today's session.He demonstrates improved technique on UE exercises, but benefits from cueing to perform in a slowed and controlled manner.  He demonstrates more difficulty with balance today, with more instances of LOB requiring minA and UE support in // bars throughout.  Pt will continue to benefit from skilled PT services to address deficits in balance, strength, and pain in order to prevent future falls and to improve function at home.          PT Short Term Goals - 01/28/19 0827      PT SHORT TERM GOAL #1   Title  Pt will be independent with HEP in  order to improve strength and balance as well as strengthen R shoulder in order to decrease fall risk and improve pain-free function at home and work.    Time  6    Period  Weeks    Status  New    Target Date  03/10/19        PT Long Term Goals - 01/28/19 0827      PT LONG TERM GOAL #1   Title  Pt will improve BERG by at least 3 points in order to demonstrate clinically significant improvement in balance.    Baseline  01/27/19: 44/56    Time  12    Period  Weeks    Status  New    Target Date  04/21/19      PT LONG TERM GOAL #2   Title  Pt will improve DGI by at least 3 points in order to demonstrate clinically significant improvement in balance and decreased risk for falls.    Baseline  01/27/19: 14/24    Time  12    Period  Weeks    Target Date  04/21/19      PT LONG TERM GOAL #3   Title  Pt will improve ABC by at least 13% in order to demonstrate clinically significant improvement in balance confidence.    Baseline  01/27/19: 64.4%    Time  12    Period  Weeks    Status  New    Target Date  04/21/19      PT LONG TERM GOAL #4   Title  Pt will decrease 5TSTS by at least 3 seconds in order to demonstrate clinically significant improvement in LE strength.    Baseline  14.8 seconds    Time  12    Period  Weeks    Status  New    Target Date  04/21/19      PT LONG TERM GOAL #5   Title  Pt will decrease quick DASH score by at least 8% in order to demonstrate clinically significant reduction in disability.    Baseline  01/27/19: 20.5%    Time  12    Period  Weeks    Status  New    Target Date  04/21/19      Additional Long Term Goals   Additional Long Term Goals  Yes      PT LONG TERM GOAL #6   Title  Pt will decrease worst R shoulder pain as reported on NPRS by at least 3 points in order to demonstrate clinically significant reduction in pain.    Baseline  01/27/19: 5/10    Time  12    Period  Weeks    Status  New    Target Date  04/21/19  Plan -  02/23/19 1709    Clinical Impression Statement  Pt demonstrates good motivation throughout today's session.  He demonstrates improved technique on UE exercises, but benefits from cueing to perform in a slowed and controlled manner.  He demonstrates more difficulty with balance today, with more instances of LOB requiring minA and UE support in // bars throughout.  Pt will continue to benefit from skilled PT services to address deficits in balance, strength, and pain in order to prevent future falls and to improve function at home.    Personal Factors and Comorbidities  Comorbidity 1;Fitness;Time since onset of injury/illness/exacerbation    Comorbidities  Depression    Examination-Activity Limitations  Carry;Lift;Stairs;Caring for Others    Examination-Participation Restrictions  Community Activity;Shop;Yard Work    IT consultanttability/Clinical Decision Making  Evolving/Moderate complexity    Rehab Potential  Fair    PT Frequency  2x / week    PT Duration  12 weeks    PT Treatment/Interventions  ADLs/Self Care Home Management;Aquatic Therapy;Canalith Repostioning;Cryotherapy;Electrical Stimulation;Iontophoresis 4mg /ml Dexamethasone;Moist Heat;Traction;Ultrasound;DME Instruction;Gait training;Stair training;Functional mobility training;Therapeutic activities;Therapeutic exercise;Balance training;Neuromuscular re-education;Patient/family education;Manual techniques;Dry needling;Vestibular;Spinal Manipulations;Joint Manipulations    PT Next Visit Plan  progress balance and strength; R shoulder and balance    PT Home Exercise Plan  [id number]       Patient will benefit from skilled therapeutic intervention in order to improve the following deficits and impairments:  Abnormal gait, Decreased balance, Decreased strength, Difficulty walking, Pain  Visit Diagnosis: Unsteadiness on feet  Chronic right shoulder pain     Problem List There are no active problems to display for this patient.   This entire  session was performed under direct supervision and direction of a licensed therapist/therapist assistant . I have personally read, edited and approve of the note as written.   Ronie Spiesracy Leiby Pigeon, SPT Lynnea MaizesJason D Huprich PT, DPT, GCS  Huprich,Jason 02/24/2019, 4:59 PM  [place] [facility] [address] [place], [place], [address] Phone: [phone]   Fax:  [phone]  Name: Carson Myrtledrian Cloe MRN: [id number] Date of Birth: 16-Jun-1980

## 2019-03-02 ENCOUNTER — Other Ambulatory Visit: Payer: Self-pay

## 2019-03-02 ENCOUNTER — Ambulatory Visit: Payer: BLUE CROSS/BLUE SHIELD | Attending: Neurology

## 2019-03-02 DIAGNOSIS — M25511 Pain in right shoulder: Secondary | ICD-10-CM | POA: Diagnosis present

## 2019-03-02 DIAGNOSIS — R2681 Unsteadiness on feet: Secondary | ICD-10-CM | POA: Diagnosis not present

## 2019-03-02 DIAGNOSIS — G8929 Other chronic pain: Secondary | ICD-10-CM | POA: Insufficient documentation

## 2019-03-02 NOTE — Therapy (Signed)
[place] [facility] [address] [place], [place], [address] Phone: [phone]   Fax:  [phone]  Physical Therapy Treatment  Patient Details  Name: Danny Cooper MRN: [id number] Date of Birth: May 26, 1980 Referring Provider (PT): Dr. Sherryll BurgerShah   Encounter Date: 03/02/2019  PT End of Session - 03/02/19 1630    Visit Number  7    Number of Visits  25    Date for PT Re-Evaluation  04/22/19    Authorization Type  eval 01/28/19    PT Start Time  0420    PT Stop Time  0505    PT Time Calculation (min)  45 min    Equipment Utilized During Treatment  Gait belt    Activity Tolerance  Patient tolerated treatment well    Behavior During Therapy  [facility] for tasks assessed/performed       History reviewed. No pertinent past medical history.  History reviewed. No pertinent surgical history.  There were no vitals filed for this visit.  Subjective Assessment - 03/02/19 1629    Subjective  Pt reports that he is doing well today. He reports no pain or soreness today or after last session.  He does not endorse any falls since last visit, but states that he has been more unsteady.  No specific questions or concerns at this time.    Pertinent History  Pt is here for imbalance, R shoulder pain, and L flank pain. 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 He has been having imbalance for multiple years but has never had any therapy for this issue. He ambulates using a cane or walking stick in his RUE. He also has a rolling walker but doesn't use it. He endorses 2-3 falls in the last 6 months which tipically occur laterally or as a result of tripping over his feet. In addition he also has been having R shoulder pain for the last 2-3 months. He believes that the pain might have started after he fell on his R shoulder but he is unsure. He is also complaining of L flank pain today but this was not discussed in  depth. He was recently started on Chantix for the ataxia. Pt reports that he also has a history of sleep apnea. Denies red flag symptoms.    Limitations  Walking    How long can you walk comfortably?  No limitations    Diagnostic tests  MRI, see history in evaluation    Patient Stated Goals  Improve balance, relieve L rib pain, decrease numbness/tingling in legs, improve quality of life    Currently in Pain?  No/denies          TREATMENT   Ther-ex NuStep 5 min for warm-up L3 during history Standingscapular retractionwith anchoredresistancewith green tband 2x15 with 3s hold; Standing shoulder ER with red band 2x12 Stand "Y's"  2 x 12 yellow Seated R shoulder abduction with green tband x12 with 3s hold; Seated R shoulder flexion with green band x12    Neuromusuclar Re-ed: All exercises performed with UE support and CGA/minA+1 in // bars: semitandem with eyes open (EO) with horizontal head turns 2x45s each direction; Forward and retrogait in // bars x3 lengths without UE support throughout. Side steps on airex balance beam x 2 lengths in each direction with intermittent SUE support in // bars, and CGA with occasional minA from therapist.    Pt educated throughout session about proper posture and technique with exercises. Improved  exercise technique, movement at target joints, use of target muscles after min to mod verbal, visual, tactile cues.   Pt demonstrates good motivation throughout today's session.He demonstrates good technique on shoulder exercises today, with the ability to tolerate increased reps today.  He responded well to the progression of balance exercises, but did display difficulty with side stepping on the airex balance beam, requiring up to minA for steadying and SUE support intermittently.  Pt will continue to benefit from skilled PT services to address deficits in balance, strength, and pain in order to prevent future falls and to improve  function at home.           PT Short Term Goals - 01/28/19 0827      PT SHORT TERM GOAL #1   Title  Pt will be independent with HEP in order to improve strength and balance as well as strengthen R shoulder in order to decrease fall risk and improve pain-free function at home and work.    Time  6    Period  Weeks    Status  New    Target Date  03/10/19        PT Long Term Goals - 01/28/19 0827      PT LONG TERM GOAL #1   Title  Pt will improve BERG by at least 3 points in order to demonstrate clinically significant improvement in balance.    Baseline  01/27/19: 44/56    Time  12    Period  Weeks    Status  New    Target Date  04/21/19      PT LONG TERM GOAL #2   Title  Pt will improve DGI by at least 3 points in order to demonstrate clinically significant improvement in balance and decreased risk for falls.    Baseline  01/27/19: 14/24    Time  12    Period  Weeks    Target Date  04/21/19      PT LONG TERM GOAL #3   Title  Pt will improve ABC by at least 13% in order to demonstrate clinically significant improvement in balance confidence.    Baseline  01/27/19: 64.4%    Time  12    Period  Weeks    Status  New    Target Date  04/21/19      PT LONG TERM GOAL #4   Title  Pt will decrease 5TSTS by at least 3 seconds in order to demonstrate clinically significant improvement in LE strength.    Baseline  14.8 seconds    Time  12    Period  Weeks    Status  New    Target Date  04/21/19      PT LONG TERM GOAL #5   Title  Pt will decrease quick DASH score by at least 8% in order to demonstrate clinically significant reduction in disability.    Baseline  01/27/19: 20.5%    Time  12    Period  Weeks    Status  New    Target Date  04/21/19      Additional Long Term Goals   Additional Long Term Goals  Yes      PT LONG TERM GOAL #6   Title  Pt will decrease worst R shoulder pain as reported on NPRS by at least 3 points in order to demonstrate clinically significant  reduction in pain.    Baseline  01/27/19: 5/10    Time  12  Period  Weeks    Status  New    Target Date  04/21/19            Plan - 03/02/19 1714    Clinical Impression Statement  Pt demonstrates good motivation throughout today's session.  He demonstrates good technique on shoulder exercises today, with the ability to tolerate increased reps today.  He responded well to the progression of balance exercises, but did display difficulty with side stepping on the airex balance beam, requiring up to minA for steadying and SUE support intermittently.  Pt will continue to benefit from skilled PT services to address deficits in balance, strength, and pain in order to prevent future falls and to improve function at home.    Personal Factors and Comorbidities  Comorbidity 1;Fitness;Time since onset of injury/illness/exacerbation    Comorbidities  Depression    Examination-Activity Limitations  Carry;Lift;Stairs;Caring for Others    Examination-Participation Restrictions  Community Activity;Shop;Yard Work    Journalist, newspaper    Rehab Potential  Fair    PT Frequency  2x / week    PT Duration  12 weeks    PT Treatment/Interventions  ADLs/Self Care Home Management;Aquatic Therapy;Canalith Repostioning;Cryotherapy;Electrical Stimulation;Iontophoresis 4mg /ml Dexamethasone;Moist Heat;Traction;Ultrasound;DME Instruction;Gait training;Stair training;Functional mobility training;Therapeutic activities;Therapeutic exercise;Balance training;Neuromuscular re-education;Patient/family education;Manual techniques;Dry needling;Vestibular;Spinal Manipulations;Joint Manipulations    PT Next Visit Plan  progress balance and strength; R shoulder and balance    PT Home Exercise Plan  [id number]       Patient will benefit from skilled therapeutic intervention in order to improve the following deficits and impairments:  Abnormal gait, Decreased balance, Decreased strength,  Difficulty walking, Pain  Visit Diagnosis: Unsteadiness on feet  Chronic right shoulder pain     Problem List There are no active problems to display for this patient.   This entire session was performed under direct supervision and direction of a licensed therapist/therapist assistant . I have personally read, edited and approve of the note as written.   Lutricia Horsfall, SPT Phillips Grout PT, DPT, GCS  Huprich,Jason 03/03/2019, 11:44 AM  [place] MAIN [facility] SERVICES [address] [place], [place], [address] Phone: [phone]   Fax:  [phone]  Name: Danny Cooper MRN: [id number] Date of Birth: 10/21/1980

## 2019-03-04 ENCOUNTER — Ambulatory Visit: Payer: BLUE CROSS/BLUE SHIELD

## 2019-03-04 ENCOUNTER — Other Ambulatory Visit: Payer: Self-pay

## 2019-03-04 DIAGNOSIS — G8929 Other chronic pain: Secondary | ICD-10-CM

## 2019-03-04 DIAGNOSIS — M25511 Pain in right shoulder: Secondary | ICD-10-CM

## 2019-03-04 DIAGNOSIS — R2681 Unsteadiness on feet: Secondary | ICD-10-CM

## 2019-03-04 NOTE — Therapy (Addendum)
[place] [facility] MAIN [facility] SERVICES [address] [place], [place], [address] Phone: [phone]   Fax:  [phone]  Physical Therapy Treatment  Patient Details  Name: Danny Cooper MRN: [id number] Date of Birth: [birth year] Referring Provider (PT): Dr. Sherryll Burger   Encounter Date: 03/04/2019  PT End of Session - 03/04/19 1544    Visit Number  8    Number of Visits  25    Date for PT Re-Evaluation  04/22/19    Authorization Type  eval 01/28/19    PT Start Time  1537    PT Stop Time  1615    PT Time Calculation (min)  38 min    Equipment Utilized During Treatment  Gait belt    Activity Tolerance  Patient tolerated treatment well    Behavior During Therapy  [facility] for tasks assessed/performed       History reviewed. No pertinent past medical history.  History reviewed. No pertinent surgical history.  There were no vitals filed for this visit.  Subjective Assessment - 03/04/19 1542    Subjective  Pt reports that he is doing well today. He reports that his R upper trap was a little sore after last session, but is okay today.  He does not endorse any falls since last visit, but endorses some near falls.  No specific questions or concerns at this time.    Pertinent History  Pt is here for imbalance, R shoulder pain, and L flank pain. 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 He has been having imbalance for multiple years but has never had any therapy for this issue. He ambulates using a cane or walking stick in his RUE. He also has a rolling walker but doesn't use it. He endorses 2-3 falls in the last 6 months which tipically occur laterally or as a result of tripping over his feet. In addition he also has been having R shoulder pain for the last 2-3 months. He believes that the pain might have started after he fell on his R shoulder but he is unsure. He is also complaining of L flank pain today but this was  not discussed in depth. He was recently started on Chantix for the ataxia. Pt reports that he also has a history of sleep apnea. Denies red flag symptoms.    Limitations  Walking    How long can you walk comfortably?  No limitations    Diagnostic tests  MRI, see history in evaluation    Patient Stated Goals  Improve balance, relieve L rib pain, decrease numbness/tingling in legs, improve quality of life    Currently in Pain?  No/denies          TREATMENT   Ther-ex NuStep 5 min for warm-up L0during history Standingscapular retractionwith anchoredresistancewith green tband 2x15with 3s hold; Standing shoulder ER with red band 2x12 Stand "Y's" 2 x 12 yellow Standing "W's" 2x12 yellow   Neuromusuclar Re-ed: All exercises performed with UE support and CGA/minA+1 in // bars: semitandem with eyes open (EO) with horizontal head turns 2x45s each direction; Forward gait in // bars x6 lengths stepping over 2 orange hurdles and 2 pool noodles with intermittent SUE support throughout. Side steps on airex balance beam x 2 lengths in each direction with intermittent SUE support in // bars, and CGA with occasional minA from therapist. Forward and retrogait on airex balance beam x2 lengths in each direction in // bars with intermittent SUE forward and intermittent  BUE backwards, and CGA with occasional minA from therapist. Tandem gait in // bars on level ground with intermittent BUE support for steadying x2 lengths     Pt educated throughout session about proper posture and technique with exercises. Improved exercise technique, movement at target joints, use of target muscles after min to mod verbal, visual, tactile cues.   Pt demonstrates good motivation throughout today's session.He demonstrates good technique on shoulder exercises today, with cueing to avoid hiking his shoulder.  He continues to be challenged with balance exercises, but demonstrates the ability to use less  UE support throughout as exercises progress in // bars.  He responded well to stepping over objects today in the // bars, but does require intermittent UE support and minA for steadying. Pt will continue to benefit from skilled PT services to address deficits in balance, strength, and pain in order to prevent future falls and to improve function at home.            PT Short Term Goals - 01/28/19 0827      PT SHORT TERM GOAL #1   Title  Pt will be independent with HEP in order to improve strength and balance as well as strengthen R shoulder in order to decrease fall risk and improve pain-free function at home and work.    Time  6    Period  Weeks    Status  New    Target Date  03/10/19        PT Long Term Goals - 01/28/19 0827      PT LONG TERM GOAL #1   Title  Pt will improve BERG by at least 3 points in order to demonstrate clinically significant improvement in balance.    Baseline  01/27/19: 44/56    Time  12    Period  Weeks    Status  New    Target Date  04/21/19      PT LONG TERM GOAL #2   Title  Pt will improve DGI by at least 3 points in order to demonstrate clinically significant improvement in balance and decreased risk for falls.    Baseline  01/27/19: 14/24    Time  12    Period  Weeks    Target Date  04/21/19      PT LONG TERM GOAL #3   Title  Pt will improve ABC by at least 13% in order to demonstrate clinically significant improvement in balance confidence.    Baseline  01/27/19: 64.4%    Time  12    Period  Weeks    Status  New    Target Date  04/21/19      PT LONG TERM GOAL #4   Title  Pt will decrease 5TSTS by at least 3 seconds in order to demonstrate clinically significant improvement in LE strength.    Baseline  14.8 seconds    Time  12    Period  Weeks    Status  New    Target Date  04/21/19      PT LONG TERM GOAL #5   Title  Pt will decrease quick DASH score by at least 8% in order to demonstrate clinically significant reduction in  disability.    Baseline  01/27/19: 20.5%    Time  12    Period  Weeks    Status  New    Target Date  04/21/19      Additional Long Term Goals   Additional Long Term  Goals  Yes      PT LONG TERM GOAL #6   Title  Pt will decrease worst R shoulder pain as reported on NPRS by at least 3 points in order to demonstrate clinically significant reduction in pain.    Baseline  01/27/19: 5/10    Time  12    Period  Weeks    Status  New    Target Date  04/21/19            Plan - 03/04/19 1747    Clinical Impression Statement  Pt demonstrates good motivation throughout today's session.  He demonstrates good technique on shoulder exercises today, with cueing to avoid hiking his shoulder.  He continues to be challenged with balance exercises, but demonstrates the ability to use less UE support throughout as exercises progress in // bars.  He responded well to stepping over objects today in the // bars, but does require intermittent UE support and minA for steadying.  Pt will continue to benefit from skilled PT services to address deficits in balance, strength, and pain in order to prevent future falls and to improve function at home.    Personal Factors and Comorbidities  Comorbidity 1;Fitness;Time since onset of injury/illness/exacerbation    Comorbidities  Depression    Examination-Activity Limitations  Carry;Lift;Stairs;Caring for Others    Examination-Participation Restrictions  Community Activity;Shop;Yard Work    Journalist, newspaper    Rehab Potential  Fair    PT Frequency  2x / week    PT Duration  12 weeks    PT Treatment/Interventions  ADLs/Self Care Home Management;Aquatic Therapy;Canalith Repostioning;Cryotherapy;Electrical Stimulation;Iontophoresis 4mg /ml Dexamethasone;Moist Heat;Traction;Ultrasound;DME Instruction;Gait training;Stair training;Functional mobility training;Therapeutic activities;Therapeutic exercise;Balance  training;Neuromuscular re-education;Patient/family education;Manual techniques;Dry needling;Vestibular;Spinal Manipulations;Joint Manipulations    PT Next Visit Plan  progress balance and strength; R shoulder and balance    PT Home Exercise Plan  [id number]       Patient will benefit from skilled therapeutic intervention in order to improve the following deficits and impairments:  Abnormal gait, Decreased balance, Decreased strength, Difficulty walking, Pain  Visit Diagnosis: Unsteadiness on feet  Chronic right shoulder pain     Problem List There are no active problems to display for this patient.   This entire session was performed under direct supervision and direction of a licensed therapist/therapist assistant . I have personally read, edited and approve of the note as written.   Lutricia Horsfall, SPT Phillips Grout PT, DPT, GCS  Huprich,Jason 03/05/2019, 10:57 AM  [place] MAIN [facility] SERVICES [address] [place], [place], [address] Phone: [phone]   Fax:  [phone]  Name: Danny Cooper MRN: [id number] Date of Birth: [birth year]

## 2019-03-08 ENCOUNTER — Ambulatory Visit: Payer: BLUE CROSS/BLUE SHIELD

## 2019-03-10 ENCOUNTER — Ambulatory Visit: Payer: BLUE CROSS/BLUE SHIELD

## 2019-03-16 ENCOUNTER — Ambulatory Visit: Payer: BLUE CROSS/BLUE SHIELD

## 2019-03-16 ENCOUNTER — Other Ambulatory Visit: Payer: Self-pay

## 2019-03-16 DIAGNOSIS — R2681 Unsteadiness on feet: Secondary | ICD-10-CM

## 2019-03-16 DIAGNOSIS — G8929 Other chronic pain: Secondary | ICD-10-CM

## 2019-03-16 NOTE — Therapy (Signed)
[place] [facility] MAIN [facility] SERVICES [address] [place], [place], [address] Phone: [phone]   Fax:  [phone]  Physical Therapy Treatment  Patient Details  Name: Danny Cooper MRN: [id number] Date of Birth: Dec 06, 1980 Referring Provider (PT): Dr. Sherryll Burger   Encounter Date: 03/16/2019  PT End of Session - 03/16/19 1723    Visit Number  9    Number of Visits  25    Date for PT Re-Evaluation  04/22/19    Authorization Type  eval 01/28/19    PT Start Time  1600    PT Stop Time  1645    PT Time Calculation (min)  45 min    Equipment Utilized During Treatment  Gait belt    Activity Tolerance  Patient tolerated treatment well    Behavior During Therapy  [facility] for tasks assessed/performed       History reviewed. No pertinent past medical history.  History reviewed. No pertinent surgical history.  There were no vitals filed for this visit.  Subjective Assessment - 03/16/19 1608    Subjective  Pt reports that he is doing well today. He reports that his R shoulder was bothering him Sunday and Monday, but feels a little better today.  He reports that he had 2 falls on Thursday or Friday, falling on his R knee.  No specific questions or concerns at this time.    Pertinent History  Pt is here for imbalance, R shoulder pain, and L flank pain. 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 He has been having imbalance for multiple years but has never had any therapy for this issue. He ambulates using a cane or walking stick in his RUE. He also has a rolling walker but doesn't use it. He endorses 2-3 falls in the last 6 months which tipically occur laterally or as a result of tripping over his feet. In addition he also has been having R shoulder pain for the last 2-3 months. He believes that the pain might have started after he fell on his R shoulder but he is unsure. He is also complaining of L flank pain today but  this was not discussed in depth. He was recently started on Chantix for the ataxia. Pt reports that he also has a history of sleep apnea. Denies red flag symptoms.    Limitations  Walking    How long can you walk comfortably?  No limitations    Diagnostic tests  MRI, see history in evaluation    Patient Stated Goals  Improve balance, relieve L rib pain, decrease numbness/tingling in legs, improve quality of life    Currently in Pain?  Yes    Pain Score  1     Pain Location  Shoulder    Pain Orientation  Right    Pain Descriptors / Indicators  Sore;Aching    Pain Type  Chronic pain    Pain Onset  More than a month ago    Pain Frequency  Intermittent         TREATMENT   Ther-ex NuStep 5 min for warm-up L0during history Standingscapular retractionwith anchoredresistancewith green tband 2x15with 3s hold; Standing shoulder ER with red band 2x12 Stand "Y's" 2 x 12yellow Standing "W's" 2x12 yellow   Neuromusuclar Re-ed: All exercises performed with UE support and CGA/minA+1:  Narrow stance on foam witheyes open (EO) with horizontal head turns 3x15 head turns eachdirection; CGA with occasional minA provided for steadying  Marching on  firm surface 2x12 performed bilaterally; trouble with eccentric control; CGA with intermittent minA throughout  Balance on 1/2 foam roll with flat side up, balancing with foam roll horiziontal with feet shoulder width apart; 3x30s     Pt educated throughout session about proper posture and technique with exercises. Improved exercise technique, movement at target joints, use of target muscles after min to mod verbal, visual, tactile cues.   Pt demonstrates good motivation throughout today's session.He continues to demonstrate good technique on shoulder exercises, however does require CGA due to difficulty with balance. He responded well to the initiation of marching exercises, encouraging SLS, however he does have dfficulty  with eccentric control, leading to an uncontrolled descent back to the ground.  He requires intermittent minA throughout for balance exercises. Pt will continue to benefit from skilled PT services to address deficits in balance, strength, and pain in order to prevent future falls and to improve function at home.                        PT Short Term Goals - 01/28/19 0827      PT SHORT TERM GOAL #1   Title  Pt will be independent with HEP in order to improve strength and balance as well as strengthen R shoulder in order to decrease fall risk and improve pain-free function at home and work.    Time  6    Period  Weeks    Status  New    Target Date  03/10/19        PT Long Term Goals - 01/28/19 0827      PT LONG TERM GOAL #1   Title  Pt will improve BERG by at least 3 points in order to demonstrate clinically significant improvement in balance.    Baseline  01/27/19: 44/56    Time  12    Period  Weeks    Status  New    Target Date  04/21/19      PT LONG TERM GOAL #2   Title  Pt will improve DGI by at least 3 points in order to demonstrate clinically significant improvement in balance and decreased risk for falls.    Baseline  01/27/19: 14/24    Time  12    Period  Weeks    Target Date  04/21/19      PT LONG TERM GOAL #3   Title  Pt will improve ABC by at least 13% in order to demonstrate clinically significant improvement in balance confidence.    Baseline  01/27/19: 64.4%    Time  12    Period  Weeks    Status  New    Target Date  04/21/19      PT LONG TERM GOAL #4   Title  Pt will decrease 5TSTS by at least 3 seconds in order to demonstrate clinically significant improvement in LE strength.    Baseline  14.8 seconds    Time  12    Period  Weeks    Status  New    Target Date  04/21/19      PT LONG TERM GOAL #5   Title  Pt will decrease quick DASH score by at least 8% in order to demonstrate clinically significant reduction in disability.    Baseline   01/27/19: 20.5%    Time  12    Period  Weeks    Status  New    Target Date  04/21/19  Additional Long Term Goals   Additional Long Term Goals  Yes      PT LONG TERM GOAL #6   Title  Pt will decrease worst R shoulder pain as reported on NPRS by at least 3 points in order to demonstrate clinically significant reduction in pain.    Baseline  01/27/19: 5/10    Time  12    Period  Weeks    Status  New    Target Date  04/21/19            Plan - 03/16/19 1722    Clinical Impression Statement  Pt demonstrates good motivation throughout today's session.  He continues to demonstrate good technique on shoulder exercises, however does require CGA due to difficulty with balance.  He responded well to the initiation of marching exercises, encouraging SLS, however he does have dfficulty with eccentric control, leading to an uncontrolled descent back to the ground.  He requires intermittent minA throughout for balance exercises.  Pt will continue to benefit from skilled PT services to address deficits in balance, strength, and pain in order to prevent future falls and to improve function at home.    Personal Factors and Comorbidities  Comorbidity 1;Fitness;Time since onset of injury/illness/exacerbation    Comorbidities  Depression    Examination-Activity Limitations  Carry;Lift;Stairs;Caring for Others    Examination-Participation Restrictions  Community Activity;Shop;Yard Work    IT consultanttability/Clinical Decision Making  Evolving/Moderate complexity    Rehab Potential  Fair    PT Frequency  2x / week    PT Duration  12 weeks    PT Treatment/Interventions  ADLs/Self Care Home Management;Aquatic Therapy;Canalith Repostioning;Cryotherapy;Electrical Stimulation;Iontophoresis 4mg /ml Dexamethasone;Moist Heat;Traction;Ultrasound;DME Instruction;Gait training;Stair training;Functional mobility training;Therapeutic activities;Therapeutic exercise;Balance training;Neuromuscular re-education;Patient/family  education;Manual techniques;Dry needling;Vestibular;Spinal Manipulations;Joint Manipulations    PT Next Visit Plan  progress balance and strength; R shoulder and balance    PT Home Exercise Plan  [id number]       Patient will benefit from skilled therapeutic intervention in order to improve the following deficits and impairments:  Abnormal gait, Decreased balance, Decreased strength, Difficulty walking, Pain  Visit Diagnosis: Unsteadiness on feet  Chronic right shoulder pain     Problem List There are no active problems to display for this patient.    Ronie Spiesracy Korby Ratay, SPT Lynnea MaizesJason D Huprich PT, DPT, GCS  Huprich,Jason 03/17/2019, 11:40 AM  [place] [facility] MAIN [facility] SERVICES [address] [place], [place], [address] Phone: [phone]   Fax:  [phone]  Name: Danny Cooper MRN: [id number] Date of Birth: November 19, 1980

## 2019-03-18 ENCOUNTER — Ambulatory Visit: Payer: BLUE CROSS/BLUE SHIELD

## 2019-03-18 ENCOUNTER — Other Ambulatory Visit: Payer: Self-pay

## 2019-03-18 DIAGNOSIS — R2681 Unsteadiness on feet: Secondary | ICD-10-CM | POA: Diagnosis not present

## 2019-03-18 DIAGNOSIS — G8929 Other chronic pain: Secondary | ICD-10-CM

## 2019-03-18 NOTE — Therapy (Addendum)
[place] MAIN [facility] SERVICES [address] [place], [place], [address] Phone: [phone]   Fax:  [phone]  Physical Therapy Progress Note   Dates of reporting period  01/28/19   to   03/18/19  Patient Details  Name: Danny Cooper MRN: [id number] Date of Birth: Oct 08, 1980 Referring Provider (PT): Dr. Manuella Ghazi   Encounter Date: 03/18/2019  PT End of Session - 03/18/19 1652    Visit Number  10    Number of Visits  25    Date for PT Re-Evaluation  04/22/19    Authorization Type  eval 01/28/19, progress note 03/18/19    PT Start Time  1638    PT Stop Time  1730    PT Time Calculation (min)  40 min    Equipment Utilized During Treatment  Gait belt    Activity Tolerance  Patient tolerated treatment well    Behavior During Therapy  [facility] for tasks assessed/performed       History reviewed. No pertinent past medical history.  History reviewed. No pertinent surgical history.  There were no vitals filed for this visit.  Subjective Assessment - 03/18/19 1651    Subjective  Pt reports that he is doing well today. He reports that his R shoulder was bothering him earlier today but not currently. No falls since his last therapy session.  No specific questions or concerns at this time.    Pertinent History  Pt is here for imbalance, R shoulder pain, and L flank pain. 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 He has been having imbalance for multiple years but has never had any therapy for this issue. He ambulates using a cane or walking stick in his RUE. He also has a rolling walker but doesn't use it. He endorses 2-3 falls in the last 6 months which tipically occur laterally or as a result of tripping over his feet. In addition he also has been having R shoulder pain for the last 2-3 months. He believes that the pain might have started after he fell on his R shoulder but he is unsure. He is also  complaining of L flank pain today but this was not discussed in depth. He was recently started on Chantix for the ataxia. Pt reports that he also has a history of sleep apnea. Denies red flag symptoms.    Limitations  Walking    How long can you walk comfortably?  No limitations    Diagnostic tests  MRI, see history in evaluation    Patient Stated Goals  Improve balance, relieve L rib pain, decrease numbness/tingling in legs, improve quality of life    Currently in Pain?  No/denies         [facility] PT Assessment - 03/18/19 1709      Berg Balance Test   Sit to Stand  Able to stand without using hands and stabilize independently    Standing Unsupported  Able to stand safely 2 minutes    Sitting with Back Unsupported but Feet Supported on Floor or Stool  Able to sit safely and securely 2 minutes    Stand to Sit  Sits safely with minimal use of hands    Transfers  Able to transfer safely, minor use of hands    Standing Unsupported with Eyes Closed  Able to stand 10 seconds with supervision    Standing Unsupported with Feet Together  Able to place feet together independently and stand for 1  minute with supervision    From Standing, Reach Forward with Outstretched Arm  Can reach confidently >25 cm (10")    From Standing Position, Pick up Object from [place] to pick up shoe, needs supervision    From Standing Position, Turn to Look Behind Over each Shoulder  Looks behind from both sides and weight shifts well    Turn 360 Degrees  Able to turn 360 degrees safely but slowly    Standing Unsupported, Alternately Place Feet on Step/Stool  Able to complete >2 steps/needs minimal assist    Standing Unsupported, One Foot in [place] to plae foot ahead of the other independently and hold 30 seconds    Standing on One Leg  Tries to lift leg/unable to hold 3 seconds but remains standing independently    Total Score  44      Dynamic Gait Index   Level Surface  Mild Impairment    Change in Gait Speed   Mild Impairment    Gait with Horizontal Head Turns  Mild Impairment    Gait with Vertical Head Turns  Mild Impairment    Gait and Pivot Turn  Mild Impairment    Step Over Obstacle  Moderate Impairment    Step Around Obstacles  Normal    Steps  Mild Impairment    Total Score  16          TREATMENT   Ther-ex NuStep 5 min for warm-up L3 during history; Pt completed quickDASH (27.3%) and ABC questionnaires (50%); Standingscapular retractionlow row with anchoredresistancewith green tband x 15with 3s hold; Standing shoulder ER with green tband x 10; Stand "Y's" x 10with green tband; Standing "W's" x 10 with green tband;   Neuromusuclar Re-ed: Completed outcome measures with patient including DGI (16/24), BERG (44/56), and 5TSTS (11.7s);   Pt educated throughout session about proper posture and technique with exercises. Improved exercise technique, movement at target joints, use of target muscles after min to mod verbal, visual, tactile cues.   Pt reports no change in his shoulder pain. He reports a slight improvement in his balance. However his quickDASH and ABC scales both worsened since the initial evaluation. His DGI improved slightly from 14/24 to 16/24 and his BERG remained unchanged at 44/56. His worst shoulder pain has decreased from 5/10 to 4/10 and his 5TSTS decreased from 14.8s to 11.7s. Overall pt appears to be making very slight progress and has met one long term goal at this time.He will continue to benefit from skilled PT services to address deficits in balance, strength, and pain in order to prevent future falls and to improve function at home.                        PT Short Term Goals - 03/18/19 1653      PT SHORT TERM GOAL #1   Title  Pt will be independent with HEP in order to improve strength and balance as well as strengthen R shoulder in order to decrease fall risk and improve pain-free function at home and work.    Time   6    Period  Weeks    Status  Achieved    Target Date  03/10/19        PT Long Term Goals - 03/18/19 1653      PT LONG TERM GOAL #1   Title  Pt will improve BERG by at least 3 points in order to demonstrate clinically significant  improvement in balance.    Baseline  01/27/19: 44/56; 03/18/19: 44/56    Time  12    Period  Weeks    Status  On-going    Target Date  04/21/19      PT LONG TERM GOAL #2   Title  Pt will improve DGI by at least 3 points in order to demonstrate clinically significant improvement in balance and decreased risk for falls.    Baseline  01/27/19: 14/24; 03/18/19: 16/24    Time  12    Period  Weeks    Status  Partially Met    Target Date  04/21/19      PT LONG TERM GOAL #3   Title  Pt will improve ABC by at least 13% in order to demonstrate clinically significant improvement in balance confidence.    Baseline  01/27/19: 64.4%; 03/18/19: 50%    Time  12    Period  Weeks    Status  On-going    Target Date  04/21/19      PT LONG TERM GOAL #4   Title  Pt will decrease 5TSTS by at least 3 seconds in order to demonstrate clinically significant improvement in LE strength.    Baseline  14.8 seconds; 03/18/19: 11.7s    Time  12    Period  Weeks    Status  Achieved      PT LONG TERM GOAL #5   Title  Pt will decrease quick DASH score by at least 8% in order to demonstrate clinically significant reduction in disability.    Baseline  01/27/19: 20.5%; 03/18/19: 27.3%    Time  12    Period  Weeks    Status  On-going    Target Date  04/21/19      PT LONG TERM GOAL #6   Title  Pt will decrease worst R shoulder pain as reported on NPRS by at least 3 points in order to demonstrate clinically significant reduction in pain.    Baseline  01/27/19: 5/10; 03/18/19: 4/10    Time  12    Period  Weeks    Status  Partially Met    Target Date  04/21/19            Plan - 03/18/19 1653    Clinical Impression Statement  Pt reports no change in his shoulder pain. He  reports a slight improvement in his balance. However his quickDASH and ABC scales both worsened since the initial evaluation. His DGI improved slightly from 14/24 to 16/24 and his BERG remained unchanged at 44/56. His worst shoulder pain has decreased from 5/10 to 4/10 and his 5TSTS decreased from 14.8s to 11.7s. Overall pt appears to be making very slight progress and has met one long term goal at this time.  He will continue to benefit from skilled PT services to address deficits in balance, strength, and pain in order to prevent future falls and to improve function at home.    Personal Factors and Comorbidities  Comorbidity 1;Fitness;Time since onset of injury/illness/exacerbation    Comorbidities  Depression    Examination-Activity Limitations  Carry;Lift;Stairs;Caring for Others    Examination-Participation Restrictions  Community Activity;Shop;Yard Work    Journalist, newspaper    Rehab Potential  Fair    PT Frequency  2x / week    PT Duration  12 weeks    PT Treatment/Interventions  ADLs/Self Care Home Management;Aquatic Therapy;Canalith Repostioning;Cryotherapy;Electrical Stimulation;Iontophoresis 58m/ml Dexamethasone;Moist Heat;Traction;Ultrasound;DME Instruction;Gait training;Stair training;Functional mobility  training;Therapeutic activities;Therapeutic exercise;Balance training;Neuromuscular re-education;Patient/family education;Manual techniques;Dry needling;Vestibular;Spinal Manipulations;Joint Manipulations    PT Next Visit Plan  progress balance and strength; R shoulder and balance    PT Home Exercise Plan  [id number]       Patient will benefit from skilled therapeutic intervention in order to improve the following deficits and impairments:  Abnormal gait, Decreased balance, Decreased strength, Difficulty walking, Pain  Visit Diagnosis: Unsteadiness on feet  Chronic right shoulder pain     Problem List There are no active problems to  display for this patient.  Phillips Grout PT, DPT, GCS  Saydie Gerdts 03/19/2019, 3:36 PM  [place] MAIN [facility] SERVICES [address] [place], [place], [address] Phone: [phone]   Fax:  [phone]  Name: Danny Cooper MRN: [id number] Date of Birth: 1980/06/10

## 2019-03-23 ENCOUNTER — Other Ambulatory Visit: Payer: Self-pay

## 2019-03-23 ENCOUNTER — Ambulatory Visit: Payer: BLUE CROSS/BLUE SHIELD

## 2019-03-23 DIAGNOSIS — R2681 Unsteadiness on feet: Secondary | ICD-10-CM

## 2019-03-23 DIAGNOSIS — G8929 Other chronic pain: Secondary | ICD-10-CM

## 2019-03-23 NOTE — Therapy (Signed)
Danny Cooper, Danny Cooper, Danny Phone: [phone]   Fax:  [phone]  Physical Therapy Treatment  Patient Details  Name: Danny Cooper MRN: [id number] Date of Birth: 1981-04-28 Referring Provider (PT): Dr. Manuella Ghazi   Encounter Date: Cooper  PT End of Session - 03/23/19 1615    Visit Number  11    Number of Visits  25    Date for PT Re-Evaluation  04/22/19    Authorization Type  eval 01/28/19, progress note 03/18/19    PT Start Time  1612    PT Stop Time  1645    PT Time Calculation (min)  33 min    Equipment Utilized During Treatment  Gait belt    Activity Tolerance  Patient tolerated treatment well    Behavior During Therapy  [facility] for tasks assessed/performed       History reviewed. No pertinent past medical history.  History reviewed. No pertinent surgical history.  There were no vitals filed for this visit.  Subjective Assessment - 03/23/19 1614    Subjective  Pt reports that he is doing well today. He reports that his L shoulder was actually bothering him earlier today but not currently. He complains of 2/10 L shoulder pain upon arrival. No falls since his last therapy session. No specific questions or concerns at this time.    Pertinent History  Pt is here for imbalance, R shoulder pain, and L flank pain. 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 He has been having imbalance for multiple years but has never had any therapy for this issue. He ambulates using a cane or walking stick in his RUE. He also has a rolling walker but doesn't use it. He endorses 2-3 falls in the last 6 months which tipically occur laterally or as a result of tripping over his feet. In addition he also has been having R shoulder pain for the last 2-3 months. He believes that the pain might have started after he fell on his R shoulder but he is unsure. He is also  complaining of L flank pain today but this was not discussed in depth. He was recently started on Chantix for the ataxia. Pt reports that he also has a history of sleep apnea. Denies red flag symptoms.    Limitations  Walking    How long can you walk comfortably?  No limitations    Diagnostic tests  MRI, see history in evaluation    Patient Stated Goals  Improve balance, relieve L rib pain, decrease numbness/tingling in legs, improve quality of life    Currently in Pain?  Yes    Pain Score  2     Pain Location  Shoulder    Pain Orientation  Right    Pain Descriptors / Indicators  Pressure;Sore    Pain Type  Chronic pain    Pain Onset  More than a month ago              TREATMENT   Ther-ex Supine serratus punch with manual resistance x 10; Isometric bicep curl with 30s hold x 2 in supine, 20% MVC;   Manual Therapy  Supine R shoulder A/P mobilizations at neutral grade III, 30s/bout x 3 bouts; Supine R shoulder A/P mobilizations at 90 abduction and end range ER grade III, 30s/bout x 3 bouts; Supine R shoulder inferior mobilizations at 90 flexion, grade III, 30s/bout x 2 bouts; Supine R shoulder  inferior mobilizations at 90 abduction, grade III, 30s/bout x 3 bouts; Supine R shoulder A/P mobilizations at 90 flexion pressing through elbow, grade III, 30s/bout x 3 bouts;   Trigger Point Dry Needling (TDN) Education performed with patient regarding potential benefit of TDN. Reviewed precautions and risks with patient. Reviewed special precautions/risks over lung fields which include pneumothorax. Reviewed signs and symptoms of pneumothorax and advised pt to go to ER immediately if these symptoms develop advise them of dry needling treatment. Extensive time spent with pt to ensure full understanding of TDN risks. Pt provided verbal consent to treatment. TDN performed to R upper trap with 1, 0.30 x 60 single needle placements with multiple ocal twitch responses (LTR) and jump sign.  Pistoning technique utilized. Improved pain-free motion following intervention.     Pt educated throughout session about proper posture and technique with exercises. Improved exercise technique, movement at target joints, use of target muscles after min to mod verbal, visual, tactile cues.   Pt demonstrates good motivation throughout today's session. He arrived late to session so it had to be abbreviated shortly. Session focused on R shoulder pain today. Light strengthening incorporated with manual therapy. He reports his primary pain is superior shoulder in the area of the upper trap. He has multiple trigger points in his R upper trap so dry needling was utilized today with good response. Will continue to work on R shoulder pain as well as balance at next session.Pt will continue to benefit from skilled PT services to address deficits in balance, strength, and pain in order to prevent future falls and to improve function at home.                     PT Short Term Goals - 03/18/19 1653      PT SHORT TERM GOAL #1   Title  Pt will be independent with HEP in order to improve strength and balance as well as strengthen R shoulder in order to decrease fall risk and improve pain-free function at home and work.    Time  6    Period  Weeks    Status  Achieved    Target Date  03/10/19        PT Long Term Goals - 03/18/19 1653      PT LONG TERM GOAL #1   Title  Pt will improve BERG by at least 3 points in order to demonstrate clinically significant improvement in balance.    Baseline  01/27/19: 44/56; 03/18/19: 44/56    Time  12    Period  Weeks    Status  On-going    Target Date  04/21/19      PT LONG TERM GOAL #2   Title  Pt will improve DGI by at least 3 points in order to demonstrate clinically significant improvement in balance and decreased risk for falls.    Baseline  01/27/19: 14/24; 03/18/19: 16/24    Time  12    Period  Weeks    Status  Partially Met     Target Date  04/21/19      PT LONG TERM GOAL #3   Title  Pt will improve ABC by at least 13% in order to demonstrate clinically significant improvement in balance confidence.    Baseline  01/27/19: 64.4%; 03/18/19: 50%    Time  12    Period  Weeks    Status  On-going    Target Date  04/21/19  PT LONG TERM GOAL #4   Title  Pt will decrease 5TSTS by at least 3 seconds in order to demonstrate clinically significant improvement in LE strength.    Baseline  14.8 seconds; 03/18/19: 11.7s    Time  12    Period  Weeks    Status  Achieved      PT LONG TERM GOAL #5   Title  Pt will decrease quick DASH score by at least 8% in order to demonstrate clinically significant reduction in disability.    Baseline  01/27/19: 20.5%; 03/18/19: 27.3%    Time  12    Period  Weeks    Status  On-going    Target Date  04/21/19      PT LONG TERM GOAL #6   Title  Pt will decrease worst R shoulder pain as reported on NPRS by at least 3 points in order to demonstrate clinically significant reduction in pain.    Baseline  01/27/19: 5/10; 03/18/19: 4/10    Time  12    Period  Weeks    Status  Partially Met    Target Date  04/21/19            Plan - 03/23/19 1616    Clinical Impression Statement  Pt demonstrates good motivation throughout today's session. He arrived late to session so it had to be abbreviated shortly. Session focused on R shoulder pain today. Light strengthening incorporated with manual therapy. He reports his primary pain is superior shoulder in the area of the upper trap. He has multiple trigger points in his R upper trap so dry needling was utilized today with good response. Will continue to work on R shoulder pain as well as balance at next session. Pt will continue to benefit from skilled PT services to address deficits in balance, strength, and pain in order to prevent future falls and to improve function at home.    Personal Factors and Comorbidities  Comorbidity 1;Fitness;Time since  onset of injury/illness/exacerbation    Comorbidities  Depression    Examination-Activity Limitations  Carry;Lift;Stairs;Caring for Others    Examination-Participation Restrictions  Community Activity;Shop;Yard Work    Journalist, newspaper    Rehab Potential  Fair    PT Frequency  2x / week    PT Duration  12 weeks    PT Treatment/Interventions  ADLs/Self Care Home Management;Aquatic Therapy;Canalith Repostioning;Cryotherapy;Electrical Stimulation;Iontophoresis 46m/ml Dexamethasone;Moist Heat;Traction;Ultrasound;DME Instruction;Gait training;Stair training;Functional mobility training;Therapeutic activities;Therapeutic exercise;Balance training;Neuromuscular re-education;Patient/family education;Manual techniques;Dry needling;Vestibular;Spinal Manipulations;Joint Manipulations    PT Next Visit Plan  progress balance and strength; R shoulder and balance    PT Home Exercise Plan  [id number]       Patient will benefit from skilled therapeutic intervention in order to improve the following deficits and impairments:  Abnormal gait, Decreased balance, Decreased strength, Difficulty walking, Pain  Visit Diagnosis: Unsteadiness on feet  Chronic right shoulder pain     Problem List There are no active problems to display for this patient.  Danny Cooper, Danny Cooper, Danny Cooper  Danny Cooper,Danny Cooper, Danny Cooper  [place] [facility] HospitalSERV[facility] [address] [address]Phone: [phone]  Fax:  [phone] Name: AKanaan KagawaMRN: [id number] of Birth: 5October 07, 1982

## 2019-03-30 ENCOUNTER — Other Ambulatory Visit: Payer: Self-pay

## 2019-03-30 ENCOUNTER — Ambulatory Visit: Payer: BLUE CROSS/BLUE SHIELD | Attending: Neurology

## 2019-03-30 DIAGNOSIS — R2681 Unsteadiness on feet: Secondary | ICD-10-CM | POA: Diagnosis present

## 2019-03-30 DIAGNOSIS — G8929 Other chronic pain: Secondary | ICD-10-CM

## 2019-03-30 DIAGNOSIS — M25511 Pain in right shoulder: Secondary | ICD-10-CM | POA: Diagnosis present

## 2019-03-30 NOTE — Therapy (Addendum)
[place] MAIN [facility] SERVICES [address] [place], [place], [address] Phone: [phone]   Fax:  [phone]  Physical Therapy Treatment  Patient Details  Name: Danny Cooper MRN: [id number] Date of Birth: Apr 09, 1981 Referring Provider (PT): Dr. Manuella Ghazi   Encounter Date: 03/30/2019  PT End of Session - 03/30/19 1607    Visit Number  12    Number of Visits  25    Date for PT Re-Evaluation  04/22/19    Authorization Type  eval 01/28/19, progress note 03/18/19    PT Start Time  1601    PT Stop Time  1641    PT Time Calculation (min)  40 min    Equipment Utilized During Treatment  Gait belt    Activity Tolerance  Patient tolerated treatment well;No increased pain    Behavior During Therapy  [facility] for tasks assessed/performed       No past medical history on file.  No past surgical history on file.  There were no vitals filed for this visit.  Subjective Assessment - 03/30/19 1606    Subjective  Pt doing well in general, reports he has not had shoulder pain in some time now. Regarding TPDN ne reports unconcerning sorenss x 2d, then full resolustion of pain or dyscomfort. HEP going well.    Pertinent History  Pt is here for imbalance, R shoulder pain, and L flank pain. 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 He has been having imbalance for multiple years but has never had any therapy for this issue. He ambulates using a cane or walking stick in his RUE. He also has a rolling walker but doesn't use it. He endorses 2-3 falls in the last 6 months which tipically occur laterally or as a result of tripping over his feet. In addition he also has been having R shoulder pain for the last 2-3 months. He believes that the pain might have started after he fell on his R shoulder but he is unsure. He is also complaining of L flank pain today but this was not discussed in depth. He was recently started on  Chantix for the ataxia. Pt reports that he also has a history of sleep apnea. Denies red flag symptoms.    Currently in Pain?  No/denies      INTERVENTION THIS DATE  Ther-ex -NuStep 5 min for warm-up L0, Seat 8, Arms 10 -standing cable rows 1x10 22.5lbs mod-maxA for balance, several LOB -seated cable row 2x10 @ 32.5lbs  -seated BUE cable extension 2x12 12.5lb (extensive cues for form)  -seated shoulder flexion 2x15 RUE 4lb FW -seated shoulder ABDCT 2x15 BUE 4lb FW   Neuromuscular Reeducation -four square AMB on red mat with medicine ball loading to simulate parental duties: 2 round trips -Resisted cable walking in 4 directions of resistance 1x46f with medicine ball load, initially trialled with step in route, but not well tolerated hence DC'd    PT Short Term Goals - 03/18/19 1653      PT SHORT TERM GOAL #1   Title  Pt will be independent with HEP in order to improve strength and balance as well as strengthen R shoulder in order to decrease fall risk and improve pain-free function at home and work.    Time  6    Period  Weeks    Status  Achieved    Target Date  03/10/19        PT Long Term Goals -  03/18/19 1653      PT LONG TERM GOAL #1   Title  Pt will improve BERG by at least 3 points in order to demonstrate clinically significant improvement in balance.    Baseline  01/27/19: 44/56; 03/18/19: 44/56    Time  12    Period  Weeks    Status  On-going    Target Date  04/21/19      PT LONG TERM GOAL #2   Title  Pt will improve DGI by at least 3 points in order to demonstrate clinically significant improvement in balance and decreased risk for falls.    Baseline  01/27/19: 14/24; 03/18/19: 16/24    Time  12    Period  Weeks    Status  Partially Met    Target Date  04/21/19      PT LONG TERM GOAL #3   Title  Pt will improve ABC by at least 13% in order to demonstrate clinically significant improvement in balance confidence.    Baseline  01/27/19: 64.4%; 03/18/19: 50%     Time  12    Period  Weeks    Status  On-going    Target Date  04/21/19      PT LONG TERM GOAL #4   Title  Pt will decrease 5TSTS by at least 3 seconds in order to demonstrate clinically significant improvement in LE strength.    Baseline  14.8 seconds; 03/18/19: 11.7s    Time  12    Period  Weeks    Status  Achieved      PT LONG TERM GOAL #5   Title  Pt will decrease quick DASH score by at least 8% in order to demonstrate clinically significant reduction in disability.    Baseline  01/27/19: 20.5%; 03/18/19: 27.3%    Time  12    Period  Weeks    Status  On-going    Target Date  04/21/19      PT LONG TERM GOAL #6   Title  Pt will decrease worst R shoulder pain as reported on NPRS by at least 3 points in order to demonstrate clinically significant reduction in pain.    Baseline  01/27/19: 5/10; 03/18/19: 4/10    Time  12    Period  Weeks    Status  Partially Met    Target Date  04/21/19            Plan - 03/30/19 1607    Clinical Impression Statement  In general, patient demonstrating good tolerance to therapy session this date, reasonable accommodations are alllowed in-session to allow adequate rest between activities as needed. All interventional executed without any exacerbation of pain or other symptoms.  Pt given intermittent multimodal cues to teach best possible form with exercises. Pt continues to make steady progress toward most goals. No home exercise updates made at this time.    Rehab Potential  Fair    PT Frequency  2x / week    PT Duration  12 weeks    PT Treatment/Interventions  ADLs/Self Care Home Management;Aquatic Therapy;Canalith Repostioning;Cryotherapy;Electrical Stimulation;Iontophoresis '4mg'$ /ml Dexamethasone;Moist Heat;Traction;Ultrasound;DME Instruction;Gait training;Stair training;Functional mobility training;Therapeutic activities;Therapeutic exercise;Balance training;Neuromuscular re-education;Patient/family education;Manual techniques;Dry  needling;Vestibular;Spinal Manipulations;Joint Manipulations    PT Next Visit Plan  progress balance and strength; R shoulder and balance    PT Home Exercise Plan  [id number]    Consulted and Agree with Plan of Care  Patient       Patient will benefit from skilled therapeutic intervention  in order to improve the following deficits and impairments:  Abnormal gait, Decreased balance, Decreased strength, Difficulty walking, Pain  Visit Diagnosis: Unsteadiness on feet  Chronic right shoulder pain     Problem List There are no active problems to display for this patient.  4:33 PM, 03/30/19 Etta Grandchild, PT, DPT Physical Therapist - [facility]  Outpatient Physical [place] [phone]     Etta Grandchild 03/30/2019, 4:09 PM  [place] MAIN [facility] SERVICES [address] [place], [place], [address] Phone: [phone]   Fax:  [phone]  Name: Danny Cooper MRN: [id number] Date of Birth: 08/26/1980

## 2019-04-01 ENCOUNTER — Other Ambulatory Visit: Payer: Self-pay

## 2019-04-01 ENCOUNTER — Ambulatory Visit: Payer: BLUE CROSS/BLUE SHIELD

## 2019-04-01 DIAGNOSIS — M25511 Pain in right shoulder: Secondary | ICD-10-CM

## 2019-04-01 DIAGNOSIS — R2681 Unsteadiness on feet: Secondary | ICD-10-CM | POA: Diagnosis not present

## 2019-04-01 DIAGNOSIS — G8929 Other chronic pain: Secondary | ICD-10-CM

## 2019-04-01 NOTE — Therapy (Signed)
[place] MAIN [facility] SERVICES [address] [place], [place], [address] Phone: [phone]   Fax:  [phone]  Physical Therapy Treatment  Patient Details  Name: Danny Cooper MRN: [id number] Date of Birth: 1980-12-07 Referring Provider (PT): Dr. Manuella Ghazi   Encounter Date: 04/01/2019  PT End of Session - 04/01/19 1617    Visit Number  13    Number of Visits  25    Date for PT Re-Evaluation  04/22/19    Authorization Type  eval 01/28/19, progress note 03/18/19    PT Start Time  1615    PT Stop Time  1645    PT Time Calculation (min)  30 min    Equipment Utilized During Treatment  Gait belt    Activity Tolerance  Patient tolerated treatment well;No increased pain    Behavior During Therapy  [facility] for tasks assessed/performed       History reviewed. No pertinent past medical history.  History reviewed. No pertinent surgical history.  There were no vitals filed for this visit.  Subjective Assessment - 04/01/19 1615    Subjective  Pt reports doing well today. He reports he has not had any shoulder pain after the soreness resolved from the TDN. HEP going well. No specific questions or concerns at this time.    Pertinent History  Pt is here for imbalance, R shoulder pain, and L flank pain. 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 He has been having imbalance for multiple years but has never had any therapy for this issue. He ambulates using a cane or walking stick in his RUE. He also has a rolling walker but doesn't use it. He endorses 2-3 falls in the last 6 months which tipically occur laterally or as a result of tripping over his feet. In addition he also has been having R shoulder pain for the last 2-3 months. He believes that the pain might have started after he fell on his R shoulder but he is unsure. He is also complaining of L flank pain today but this was not discussed in depth. He was  recently started on Chantix for the ataxia. Pt reports that he also has a history of sleep apnea. Denies red flag symptoms.    Limitations  Walking    How long can you walk comfortably?  No limitations    Diagnostic tests  MRI, see history in evaluation    Patient Stated Goals  Improve balance, relieve L rib pain, decrease numbness/tingling in legs, improve quality of life    Currently in Pain?  No/denies            TREATMENT   Neuromuscular Reeducation Forward/backward stepping with eyes open in // bars no UE support x 4 lengths each; Forward/backward stepping with eyes closed in // bars no UE support x 4 lengths each; Side stepping in // bars with eyes open in // bars no UE support x 2 lengths each direction; Side stepping in // bars with eyes closed in // bars no UE support x 2 lengths each direction; 6" hurdle steps without UE support x 10 on each side; 6" step toe taps on [employer] alternating feet without UE support x 10 on each side; Rockerboard balance in A/P and R/L orientation x 30s each; Rockerboard weight shifting in A/P and R/L orientation x 30s each;   Pt educated throughout session about proper posture and technique with exercises. Improved exercise technique, movement at target joints, use  of target muscles after min to mod verbal, visual, tactile cues.    Pt demonstrates good motivation throughout today's session. He arrived late to session so it had to be abbreviated. Session focused on balance today as pt reports complete resolution of R shoulder pain. Pt encouraged to notify therapist if pain returns. Pt demonstrates difficulty with balance performing eyes closed and single leg balance activities.Pt will continue to benefit from skilled PT services to address deficits in balance, strength, and pain in order to prevent future falls and to improve function at home.                     PT Short Term Goals - 03/18/19 1653      PT SHORT TERM GOAL #1    Title  Pt will be independent with HEP in order to improve strength and balance as well as strengthen R shoulder in order to decrease fall risk and improve pain-free function at home and work.    Time  6    Period  Weeks    Status  Achieved    Target Date  03/10/19        PT Long Term Goals - 03/18/19 1653      PT LONG TERM GOAL #1   Title  Pt will improve BERG by at least 3 points in order to demonstrate clinically significant improvement in balance.    Baseline  01/27/19: 44/56; 03/18/19: 44/56    Time  12    Period  Weeks    Status  On-going    Target Date  04/21/19      PT LONG TERM GOAL #2   Title  Pt will improve DGI by at least 3 points in order to demonstrate clinically significant improvement in balance and decreased risk for falls.    Baseline  01/27/19: 14/24; 03/18/19: 16/24    Time  12    Period  Weeks    Status  Partially Met    Target Date  04/21/19      PT LONG TERM GOAL #3   Title  Pt will improve ABC by at least 13% in order to demonstrate clinically significant improvement in balance confidence.    Baseline  01/27/19: 64.4%; 03/18/19: 50%    Time  12    Period  Weeks    Status  On-going    Target Date  04/21/19      PT LONG TERM GOAL #4   Title  Pt will decrease 5TSTS by at least 3 seconds in order to demonstrate clinically significant improvement in LE strength.    Baseline  14.8 seconds; 03/18/19: 11.7s    Time  12    Period  Weeks    Status  Achieved      PT LONG TERM GOAL #5   Title  Pt will decrease quick DASH score by at least 8% in order to demonstrate clinically significant reduction in disability.    Baseline  01/27/19: 20.5%; 03/18/19: 27.3%    Time  12    Period  Weeks    Status  On-going    Target Date  04/21/19      PT LONG TERM GOAL #6   Title  Pt will decrease worst R shoulder pain as reported on NPRS by at least 3 points in order to demonstrate clinically significant reduction in pain.    Baseline  01/27/19: 5/10; 03/18/19: 4/10     Time  12    Period  Weeks    Status  Partially Met    Target Date  04/21/19            Plan - 04/01/19 1617    Clinical Impression Statement  Pt demonstrates good motivation throughout today's session. He arrived late to session so it had to be abbreviated. Session focused on balance today as pt reports complete resolution of R shoulder pain. Pt encouraged to notify therapist if pain returns. Pt demonstrates difficulty with balance performing eyes closed and single leg balance activities. Pt will continue to benefit from skilled PT services to address deficits in balance, strength, and pain in order to prevent future falls and to improve function at home.    Rehab Potential  Fair    PT Frequency  2x / week    PT Duration  12 weeks    PT Treatment/Interventions  ADLs/Self Care Home Management;Aquatic Therapy;Canalith Repostioning;Cryotherapy;Electrical Stimulation;Iontophoresis '4mg'$ /ml Dexamethasone;Moist Heat;Traction;Ultrasound;DME Instruction;Gait training;Stair training;Functional mobility training;Therapeutic activities;Therapeutic exercise;Balance training;Neuromuscular re-education;Patient/family education;Manual techniques;Dry needling;Vestibular;Spinal Manipulations;Joint Manipulations    PT Next Visit Plan  progress balance and strength; R shoulder and balance    PT Home Exercise Plan  [id number]    Consulted and Agree with Plan of Care  Patient       Patient will benefit from skilled therapeutic intervention in order to improve the following deficits and impairments:  Abnormal gait, Decreased balance, Decreased strength, Difficulty walking, Pain  Visit Diagnosis: Unsteadiness on feet  Chronic right shoulder pain     Problem List There are no active problems to display for this patient.  Phillips Grout PT, DPT, GCS  Ilyse Tremain 04/01/2019, 5:22 PM  [place] MAIN [facility] SERVICES [address] [place], [place], [address] Phone:  [phone]   Fax:  [phone]  Name: Alanson Hausmann MRN: [id number] Date of Birth: 1980/12/10

## 2019-04-06 ENCOUNTER — Ambulatory Visit: Payer: BLUE CROSS/BLUE SHIELD

## 2019-04-08 ENCOUNTER — Other Ambulatory Visit: Payer: Self-pay

## 2019-04-08 ENCOUNTER — Ambulatory Visit: Payer: BLUE CROSS/BLUE SHIELD

## 2019-04-08 DIAGNOSIS — M25511 Pain in right shoulder: Secondary | ICD-10-CM

## 2019-04-08 DIAGNOSIS — R2681 Unsteadiness on feet: Secondary | ICD-10-CM | POA: Diagnosis not present

## 2019-04-08 NOTE — Therapy (Signed)
[place] MAIN [facility] SERVICES [address] [place], [place], [address] Phone: [phone]   Fax:  [phone]  Physical Therapy Treatment  Patient Details  Name: Danny Cooper MRN: [id number] Date of Birth: 1980/10/16 Referring Provider (PT): Dr. Manuella Ghazi   Encounter Date: 04/08/2019  PT End of Session - 04/08/19 1614    Visit Number  14    Number of Visits  25    Date for PT Re-Evaluation  04/22/19    Authorization Type  eval 01/28/19, progress note 03/18/19    PT Start Time  1610    PT Stop Time  1645    PT Time Calculation (min)  35 min    Equipment Utilized During Treatment  Gait belt    Activity Tolerance  Patient tolerated treatment well    Behavior During Therapy  [facility] for tasks assessed/performed       History reviewed. No pertinent past medical history.  History reviewed. No pertinent surgical history.  There were no vitals filed for this visit.  Subjective Assessment - 04/08/19 1613    Subjective  Pt states that he went to [place] last week and when he was on a ride on Saturday he had a jarring motion and his R shoulder started hurting again. He currently rates the pain as a 1/10. He had a fall the night after his last therapy session but states that he didn't injure himself.    Pertinent History  Pt is here for imbalance, R shoulder pain, and L flank pain. 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 He has been having imbalance for multiple years but has never had any therapy for this issue. He ambulates using a cane or walking stick in his RUE. He also has a rolling walker but doesn't use it. He endorses 2-3 falls in the last 6 months which tipically occur laterally or as a result of tripping over his feet. In addition he also has been having R shoulder pain for the last 2-3 months. He believes that the pain might have started after he fell on his R shoulder but he is unsure. He is  also complaining of L flank pain today but this was not discussed in depth. He was recently started on Chantix for the ataxia. Pt reports that he also has a history of sleep apnea. Denies red flag symptoms.    Limitations  Walking    How long can you walk comfortably?  No limitations    Diagnostic tests  MRI, see history in evaluation    Patient Stated Goals  Improve balance, relieve L rib pain, decrease numbness/tingling in legs, improve quality of life    Currently in Pain?  Yes    Pain Score  1     Pain Location  Shoulder    Pain Orientation  Right    Pain Descriptors / Indicators  Sore;Aching    Pain Type  Chronic pain   Recently flared up   Pain Radiating Towards  none    Pain Onset  In the past 7 days   acute on chronic   Pain Frequency  Intermittent          TREATMENT    Manual Therapy  STM to R upper trap, levator scapula and rhomboids; R upper trap stretch 30s x 2; L lateral flexion stretch x 30s;   Trigger Point Dry Needling (TDN), unbilled Education performed with patient regarding potential benefit of TDN. Reviewed precautions and  risks with patient. Pt provided verbal consent to treatment. TDN performed to R upper trap with 3, 0.30 x 50 single needle placements (2 posterior approach, 1 anterior) with multiple local twitch responses (LTR) and jump sign. Pistoning technique utilized.   Neuromuscular Re-education  6" step toe taps alternating feet without UE support x 10 on each side; 6" step ups with faded UE support until pt is performing without any UE support x 10 on each side, CGA to modA+1 provided from therapist secondary to [place];   Pt educated throughout session about proper posture and technique with exercises. Improved exercise technique, movement at target joints, use of target muscles after min to mod verbal, visual, tactile cues.   Pt arrived late to session so it had to be abbreviated shortly. Session focused on R shoulder pain again today  as it has recurred since his last session. He has multiple trigger points in his R upper trap so dry needling was utilized today with good response. Will continue to work on R shoulder pain as well as balance at next session.Pt will continue to benefit from skilled PT services to address deficits in balance, strength, and pain in order to prevent future falls and to improve function at home.                       PT Short Term Goals - 03/18/19 1653      PT SHORT TERM GOAL #1   Title  Pt will be independent with HEP in order to improve strength and balance as well as strengthen R shoulder in order to decrease fall risk and improve pain-free function at home and work.    Time  6    Period  Weeks    Status  Achieved    Target Date  03/10/19        PT Long Term Goals - 03/18/19 1653      PT LONG TERM GOAL #1   Title  Pt will improve BERG by at least 3 points in order to demonstrate clinically significant improvement in balance.    Baseline  01/27/19: 44/56; 03/18/19: 44/56    Time  12    Period  Weeks    Status  On-going    Target Date  04/21/19      PT LONG TERM GOAL #2   Title  Pt will improve DGI by at least 3 points in order to demonstrate clinically significant improvement in balance and decreased risk for falls.    Baseline  01/27/19: 14/24; 03/18/19: 16/24    Time  12    Period  Weeks    Status  Partially Met    Target Date  04/21/19      PT LONG TERM GOAL #3   Title  Pt will improve ABC by at least 13% in order to demonstrate clinically significant improvement in balance confidence.    Baseline  01/27/19: 64.4%; 03/18/19: 50%    Time  12    Period  Weeks    Status  On-going    Target Date  04/21/19      PT LONG TERM GOAL #4   Title  Pt will decrease 5TSTS by at least 3 seconds in order to demonstrate clinically significant improvement in LE strength.    Baseline  14.8 seconds; 03/18/19: 11.7s    Time  12    Period  Weeks    Status  Achieved       PT LONG  TERM GOAL #5   Title  Pt will decrease quick DASH score by at least 8% in order to demonstrate clinically significant reduction in disability.    Baseline  01/27/19: 20.5%; 03/18/19: 27.3%    Time  12    Period  Weeks    Status  On-going    Target Date  04/21/19      PT LONG TERM GOAL #6   Title  Pt will decrease worst R shoulder pain as reported on NPRS by at least 3 points in order to demonstrate clinically significant reduction in pain.    Baseline  01/27/19: 5/10; 03/18/19: 4/10    Time  12    Period  Weeks    Status  Partially Met    Target Date  04/21/19            Plan - 04/08/19 1615    Clinical Impression Statement  Pt arrived late to session so it had to be abbreviated shortly. Session focused on R shoulder pain again today as it has recurred since his last session. He has multiple trigger points in his R upper trap so dry needling was utilized today with good response. Will continue to work on R shoulder pain as well as balance at next session. Pt will continue to benefit from skilled PT services to address deficits in balance, strength, and pain in order to prevent future falls and to improve function at home.    Personal Factors and Comorbidities  Comorbidity 1;Fitness;Time since onset of injury/illness/exacerbation    Comorbidities  Depression    Examination-Activity Limitations  Carry;Lift;Stairs;Caring for Others    Examination-Participation Restrictions  Community Activity;Shop;Yard Work    [employer]  Evolving/Moderate complexity    Clinical Decision Making  Moderate    Rehab Potential  Fair    PT Frequency  2x / week    PT Duration  12 weeks    PT Treatment/Interventions  ADLs/Self Care Home Management;Aquatic Therapy;Canalith Repostioning;Cryotherapy;Electrical Stimulation;Iontophoresis '4mg'$ /ml Dexamethasone;Moist Heat;Traction;Ultrasound;DME Instruction;Gait training;Stair training;Functional mobility training;Therapeutic  activities;Therapeutic exercise;Balance training;Neuromuscular re-education;Patient/family education;Manual techniques;Dry needling;Vestibular;Spinal Manipulations;Joint Manipulations    PT Next Visit Plan  progress balance and strength; R shoulder and balance    PT Home Exercise Plan  [id number]    Consulted and Agree with Plan of Care  Patient       Patient will benefit from skilled therapeutic intervention in order to improve the following deficits and impairments:  Abnormal gait, Decreased balance, Decreased strength, Difficulty walking, Pain  Visit Diagnosis: Unsteadiness on feet  Chronic right shoulder pain     Problem List There are no problems to display for this patient.  Phillips Grout PT, DPT, GCS  Dashun Borre 04/08/2019, 5:27 PM  [place] MAIN [facility] SERVICES [address] [place], [place], [address] Phone: [phone]   Fax:  [phone]  Name: Caswell Alvillar MRN: [id number] Date of Birth: [birth year]

## 2019-04-13 ENCOUNTER — Other Ambulatory Visit: Payer: Self-pay

## 2019-04-13 ENCOUNTER — Ambulatory Visit: Payer: BLUE CROSS/BLUE SHIELD

## 2019-04-13 DIAGNOSIS — M25511 Pain in right shoulder: Secondary | ICD-10-CM

## 2019-04-13 DIAGNOSIS — R2681 Unsteadiness on feet: Secondary | ICD-10-CM | POA: Diagnosis not present

## 2019-04-13 DIAGNOSIS — G8929 Other chronic pain: Secondary | ICD-10-CM

## 2019-04-13 NOTE — Therapy (Signed)
[place] MAIN [facility] SERVICES [address] [place], [place], [address] Phone: [phone]   Fax:  [phone]  Physical Therapy Treatment  Patient Details  Name: Danny Cooper MRN: [id number] Date of Birth: [birth year] Referring Provider (PT): Dr. Manuella Ghazi   Encounter Date: 04/13/2019  PT End of Session - 04/13/19 1652    Visit Number  15    Number of Visits  25    Date for PT Re-Evaluation  04/22/19    Authorization Type  eval 01/28/19, progress note 03/18/19    PT Start Time  1603    PT Stop Time  1646    PT Time Calculation (min)  43 min    Equipment Utilized During Treatment  Gait belt    Activity Tolerance  Patient tolerated treatment well    Behavior During Therapy  [facility] for tasks assessed/performed       History reviewed. No pertinent past medical history.  History reviewed. No pertinent surgical history.  There were no vitals filed for this visit.  Subjective Assessment - 04/13/19 1651    Subjective  Patient reports no pain in his shoulder. Had a fall on Sunday when he slipped in his socks on his hardwood floor and hit the ground. Was able to get up. No pain or injury from fall.    Pertinent History  Pt is here for imbalance, R shoulder pain, and L flank pain. 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 He has been having imbalance for multiple years but has never had any therapy for this issue. He ambulates using a cane or walking stick in his RUE. He also has a rolling walker but doesn't use it. He endorses 2-3 falls in the last 6 months which tipically occur laterally or as a result of tripping over his feet. In addition he also has been having R shoulder pain for the last 2-3 months. He believes that the pain might have started after he fell on his R shoulder but he is unsure. He is also complaining of L flank pain today but this was not discussed in depth. He was recently started on  Chantix for the ataxia. Pt reports that he also has a history of sleep apnea. Denies red flag symptoms.    Limitations  Walking    How long can you walk comfortably?  No limitations    Diagnostic tests  MRI, see history in evaluation    Patient Stated Goals  Improve balance, relieve L rib pain, decrease numbness/tingling in legs, improve quality of life    Currently in Pain?  No/denies    Pain Onset  --   acute on chronic       Patient reports a fall on Sunday when slipping while wearing socks. Fell to the floor, reports no pain.      Neuromuscular Reeducation  Forward/backward stepping with eyes open in // bars no UE support x 4 lengths each; Forward/backward stepping with eyes closed in // bars no UE support x 4 lengths each; airex : one foot on 4" step one foot on airex pad 45-60 second holds each LE , x2 trials each LE Orange hurdle step over and back no UE support, very challenging to patient 10x each LE Modified single limb stance 60 seconds each LE, opp LE on soccer ball.  Unstable mat: SUE support 8x length of // bars, cueing for widened BOS  airex pad balance beam side step 4x length of //  bars frequent posterior LOB.  BUE support hip extension/step back transition into crane single limb stance 10x each LE  TherEx GTB around ankles side stepping in // bars 4x length of // bars; cueing for bilateral foot clearance squats with UE support tap to chair 10x cueing for body mechanics Squats with finger tip support, pause 3 seconds at bottom of squat slow raise to top of squat 10x.  Cowboy/monster walks 4x length of // bars with frequent cueing for increasing knee flexion and maintaining wide BOS    Pt educated throughout session about proper posture and technique with exercises. Improved exercise technique, movement at target joints, use of target muscles after min to mod verbal, visual, tactile cues.   Patient presents with good motivation throughout session. He is challenged  with spatial awareness and single limb stability.  Patient requires cueing for finding COM due to frequent posterior LOB requiring Min A to return to COM. Pt will continue to benefit from skilled PT services to address deficits in balance, strength, and pain in order to prevent future falls and to improve function at home.               PT Education - 04/13/19 1652    Education Details  exercise technique, body mechanics, single limb stability    Person(s) Educated  Patient    Methods  Explanation;Demonstration;Tactile cues;Verbal cues    Comprehension  Verbalized understanding;Returned demonstration;Verbal cues required;Tactile cues required       PT Short Term Goals - 03/18/19 1653      PT SHORT TERM GOAL #1   Title  Pt will be independent with HEP in order to improve strength and balance as well as strengthen R shoulder in order to decrease fall risk and improve pain-free function at home and work.    Time  6    Period  Weeks    Status  Achieved    Target Date  03/10/19        PT Long Term Goals - 03/18/19 1653      PT LONG TERM GOAL #1   Title  Pt will improve BERG by at least 3 points in order to demonstrate clinically significant improvement in balance.    Baseline  01/27/19: 44/56; 03/18/19: 44/56    Time  12    Period  Weeks    Status  On-going    Target Date  04/21/19      PT LONG TERM GOAL #2   Title  Pt will improve DGI by at least 3 points in order to demonstrate clinically significant improvement in balance and decreased risk for falls.    Baseline  01/27/19: 14/24; 03/18/19: 16/24    Time  12    Period  Weeks    Status  Partially Met    Target Date  04/21/19      PT LONG TERM GOAL #3   Title  Pt will improve ABC by at least 13% in order to demonstrate clinically significant improvement in balance confidence.    Baseline  01/27/19: 64.4%; 03/18/19: 50%    Time  12    Period  Weeks    Status  On-going    Target Date  04/21/19      PT LONG TERM  GOAL #4   Title  Pt will decrease 5TSTS by at least 3 seconds in order to demonstrate clinically significant improvement in LE strength.    Baseline  14.8 seconds; 03/18/19: 11.7s    Time  12    Period  Weeks    Status  Achieved      PT LONG TERM GOAL #5   Title  Pt will decrease quick DASH score by at least 8% in order to demonstrate clinically significant reduction in disability.    Baseline  01/27/19: 20.5%; 03/18/19: 27.3%    Time  12    Period  Weeks    Status  On-going    Target Date  04/21/19      PT LONG TERM GOAL #6   Title  Pt will decrease worst R shoulder pain as reported on NPRS by at least 3 points in order to demonstrate clinically significant reduction in pain.    Baseline  01/27/19: 5/10; 03/18/19: 4/10    Time  12    Period  Weeks    Status  Partially Met    Target Date  04/21/19            Plan - 04/13/19 1654    Clinical Impression Statement  Patient presents with good motivation throughout session. He is challenged with spatial awareness and single limb stability.  Patient requires cueing for finding COM due to frequent posterior LOB requiring Min A to return to COM. Pt will continue to benefit from skilled PT services to address deficits in balance, strength, and pain in order to prevent future falls and to improve function at home.    Personal Factors and Comorbidities  Comorbidity 1;Fitness;Time since onset of injury/illness/exacerbation    Comorbidities  Depression    Examination-Activity Limitations  Carry;Lift;Stairs;Caring for Others    Examination-Participation Restrictions  Community Activity;Shop;Yard Work    Journalist, newspaper    Rehab Potential  Fair    PT Frequency  2x / week    PT Duration  12 weeks    PT Treatment/Interventions  ADLs/Self Care Home Management;Aquatic Therapy;Canalith Repostioning;Cryotherapy;Electrical Stimulation;Iontophoresis 68m/ml Dexamethasone;Moist  Heat;Traction;Ultrasound;DME Instruction;Gait training;Stair training;Functional mobility training;Therapeutic activities;Therapeutic exercise;Balance training;Neuromuscular re-education;Patient/family education;Manual techniques;Dry needling;Vestibular;Spinal Manipulations;Joint Manipulations    PT Next Visit Plan  progress balance and strength; R shoulder and balance    PT Home Exercise Plan  [id number]    Consulted and Agree with Plan of Care  Patient       Patient will benefit from skilled therapeutic intervention in order to improve the following deficits and impairments:  Abnormal gait, Decreased balance, Decreased strength, Difficulty walking, Pain  Visit Diagnosis: Unsteadiness on feet  Chronic right shoulder pain     Problem List There are no problems to display for this patient.  MJanna Arch PT, DPT   04/13/2019, 4:55 PM  [place] [facility] [address] [address]Phone: [phone]  Fax:  [phone] Name: Danny RuddickMRN: [id number] of Birth: [birth year]

## 2019-04-15 ENCOUNTER — Other Ambulatory Visit: Payer: Self-pay

## 2019-04-15 ENCOUNTER — Ambulatory Visit: Payer: BLUE CROSS/BLUE SHIELD

## 2019-04-15 DIAGNOSIS — R2681 Unsteadiness on feet: Secondary | ICD-10-CM | POA: Diagnosis not present

## 2019-04-15 DIAGNOSIS — G8929 Other chronic pain: Secondary | ICD-10-CM

## 2019-04-16 NOTE — Therapy (Signed)
[place] MAIN [facility] SERVICES [address] [place], [place], [address] Phone: [phone]   Fax:  [phone]  Physical Therapy Treatment  Patient Details  Name: Danny Cooper MRN: [id number] Date of Birth: 03/14/1981 Referring Provider (PT): Dr. Manuella Ghazi   Encounter Date: 04/15/2019  PT End of Session - 04/16/19 1628    Visit Number  16    Number of Visits  25    Date for PT Re-Evaluation  04/22/19    Authorization Type  eval 01/28/19, progress note 03/18/19    PT Start Time  1600    PT Stop Time  1645    PT Time Calculation (min)  45 min    Equipment Utilized During Treatment  Gait belt    Activity Tolerance  Patient tolerated treatment well    Behavior During Therapy  [facility] for tasks assessed/performed       History reviewed. No pertinent past medical history.  History reviewed. No pertinent surgical history.  There were no vitals filed for this visit.  Subjective Assessment - 04/15/19 1611    Subjective  Patient reports some increase in his R shoulder pain recently when trying to lift or hold his son. No recent falls or loss of balance. He would like to know about CBD topical cremes for his shoulder pain.    Pertinent History  Pt is here for imbalance, R shoulder pain, and L flank pain. 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 He has been having imbalance for multiple years but has never had any therapy for this issue. He ambulates using a cane or walking stick in his RUE. He also has a rolling walker but doesn't use it. He endorses 2-3 falls in the last 6 months which tipically occur laterally or as a result of tripping over his feet. In addition he also has been having R shoulder pain for the last 2-3 months. He believes that the pain might have started after he fell on his R shoulder but he is unsure. He is also complaining of L flank pain today but this was not discussed in depth. He  was recently started on Chantix for the ataxia. Pt reports that he also has a history of sleep apnea. Denies red flag symptoms.    Limitations  Walking    How long can you walk comfortably?  No limitations    Diagnostic tests  MRI, see history in evaluation    Patient Stated Goals  Improve balance, relieve L rib pain, decrease numbness/tingling in legs, improve quality of life    Currently in Pain?  No/denies    Pain Onset  --          TREATMENT   Ther-ex Supine isometric bicep curl 50s hold at 3 positions in range of motion elbow flexion, performed twice; Supine serratus punch with manual resistance x 10; Supine rhythmic stabilization at 90 flexion with challenge provided at wrist 30s x 2;   Manual Therapy  Supine R shoulder A/P mobilizations at neutral grade III, 30s/bout x 3 bouts; Supine R shoulder A/P mobilizations at 90 flexion pressing through elbow, grade III, 30s/bout x 3 bouts; Cross body R shoulder posterior capsule stretch x 30s; STM to R bicep as well as anterior and lateral deltoid;   Trigger Point Dry Needling (TDN), unbilled Education performed with patient regarding potential benefit of TDN. Reviewed precautions and risks with patient. Pt provided verbal consent to treatment. TDN performed to R bicep with  2, 0.30 x 50 single needle placements and R anterior deltoid with 1, 0.30 x 50 single needle placement. Local twitch responses (LTR) during one bicep placement and anterior deltoid placement. Pistoning technique utilized.     Pt educated throughout session about proper posture and technique with exercises. Improved exercise technique, movement at target joints, use of target muscles after min to mod verbal, visual, tactile cues.   Session focused on R shoulder pain today. Isometric R bicep strengthening included in light shoulder strengthening. TDN performed again today this time in bicep as well as anterior deltoid. Based on limited testing today his  pain seems to be localized to his long head bicep tendon. Pt advised that he will have to speak with his MD regarding use of topical CBD. Will continue to work on R shoulder pain as well as balance at next session.Pt will continue to benefit from skilled PT services to address deficits in balance, strength, and pain in order to prevent future falls and to improve function at home.                       PT Short Term Goals - 03/18/19 1653      PT SHORT TERM GOAL #1   Title  Pt will be independent with HEP in order to improve strength and balance as well as strengthen R shoulder in order to decrease fall risk and improve pain-free function at home and work.    Time  6    Period  Weeks    Status  Achieved    Target Date  03/10/19        PT Long Term Goals - 03/18/19 1653      PT LONG TERM GOAL #1   Title  Pt will improve BERG by at least 3 points in order to demonstrate clinically significant improvement in balance.    Baseline  01/27/19: 44/56; 03/18/19: 44/56    Time  12    Period  Weeks    Status  On-going    Target Date  04/21/19      PT LONG TERM GOAL #2   Title  Pt will improve DGI by at least 3 points in order to demonstrate clinically significant improvement in balance and decreased risk for falls.    Baseline  01/27/19: 14/24; 03/18/19: 16/24    Time  12    Period  Weeks    Status  Partially Met    Target Date  04/21/19      PT LONG TERM GOAL #3   Title  Pt will improve ABC by at least 13% in order to demonstrate clinically significant improvement in balance confidence.    Baseline  01/27/19: 64.4%; 03/18/19: 50%    Time  12    Period  Weeks    Status  On-going    Target Date  04/21/19      PT LONG TERM GOAL #4   Title  Pt will decrease 5TSTS by at least 3 seconds in order to demonstrate clinically significant improvement in LE strength.    Baseline  14.8 seconds; 03/18/19: 11.7s    Time  12    Period  Weeks    Status  Achieved      PT LONG  TERM GOAL #5   Title  Pt will decrease quick DASH score by at least 8% in order to demonstrate clinically significant reduction in disability.    Baseline  01/27/19: 20.5%; 03/18/19: 27.3%  Time  12    Period  Weeks    Status  On-going    Target Date  04/21/19      PT LONG TERM GOAL #6   Title  Pt will decrease worst R shoulder pain as reported on NPRS by at least 3 points in order to demonstrate clinically significant reduction in pain.    Baseline  01/27/19: 5/10; 03/18/19: 4/10    Time  12    Period  Weeks    Status  Partially Met    Target Date  04/21/19            Plan - 04/16/19 1628    Clinical Impression Statement  Session focused on R shoulder pain today. Isometric R bicep strengthening included in light shoulder strengthening. TDN performed again today this time in bicep as well as anterior deltoid. Based on limited testing today his pain seems to be localized to his long head bicep tendon. Pt advised that he will have to speak with his MD regarding use of topical CBD. Will continue to work on R shoulder pain as well as balance at next session. Pt will continue to benefit from skilled PT services to address deficits in balance, strength, and pain in order to prevent future falls and to improve function at home.    Personal Factors and Comorbidities  Comorbidity 1;Fitness;Time since onset of injury/illness/exacerbation    Comorbidities  Depression    Examination-Activity Limitations  Carry;Lift;Stairs;Caring for Others    Examination-Participation Restrictions  Community Activity;Shop;Yard Work    Journalist, newspaper    Rehab Potential  Fair    PT Frequency  2x / week    PT Duration  12 weeks    PT Treatment/Interventions  ADLs/Self Care Home Management;Aquatic Therapy;Canalith Repostioning;Cryotherapy;Electrical Stimulation;Iontophoresis '4mg'$ /ml Dexamethasone;Moist Heat;Traction;Ultrasound;DME Instruction;Gait training;Stair  training;Functional mobility training;Therapeutic activities;Therapeutic exercise;Balance training;Neuromuscular re-education;Patient/family education;Manual techniques;Dry needling;Vestibular;Spinal Manipulations;Joint Manipulations    PT Next Visit Plan  progress balance and strength; R shoulder and balance    PT Home Exercise Plan  [id number]    Consulted and Agree with Plan of Care  Patient       Patient will benefit from skilled therapeutic intervention in order to improve the following deficits and impairments:  Abnormal gait, Decreased balance, Decreased strength, Difficulty walking, Pain  Visit Diagnosis: Unsteadiness on feet  Chronic right shoulder pain     Problem List There are no problems to display for this patient.  Phillips Grout PT, DPT, GCS  Danny Cooper 04/16/2019, 4:36 PM  [place] MAIN [facility] SERVICES [address] [place], [place], [address] Phone: [phone]   Fax:  [phone]  Name: Danny Cooper MRN: [id number] Date of Birth: 12/15/1980

## 2019-04-20 ENCOUNTER — Other Ambulatory Visit: Payer: Self-pay

## 2019-04-20 ENCOUNTER — Ambulatory Visit: Payer: BLUE CROSS/BLUE SHIELD

## 2019-04-20 DIAGNOSIS — R2681 Unsteadiness on feet: Secondary | ICD-10-CM

## 2019-04-20 DIAGNOSIS — G8929 Other chronic pain: Secondary | ICD-10-CM

## 2019-04-20 DIAGNOSIS — M25511 Pain in right shoulder: Secondary | ICD-10-CM

## 2019-04-20 NOTE — Therapy (Signed)
[place] MAIN [facility] SERVICES [address] [place], [place], [address] Phone: [phone]   Fax:  [phone]  Physical Therapy Treatment/Recertification  Patient Details  Name: Danny Cooper MRN: [id number] Date of Birth: 1981/04/19 Referring Provider (PT): Dr. Manuella Ghazi   Encounter Date: 04/20/2019  PT End of Session - 04/20/19 1557    Visit Number  17    Number of Visits  41    Date for PT Re-Evaluation  06/15/19    Authorization Type  eval 01/28/19, progress note 03/18/19    PT Start Time  1600    PT Stop Time  1645    PT Time Calculation (min)  45 min    Equipment Utilized During Treatment  Gait belt    Activity Tolerance  Patient tolerated treatment well    Behavior During Therapy  [facility] for tasks assessed/performed       History reviewed. No pertinent past medical history.  History reviewed. No pertinent surgical history.  There were no vitals filed for this visit.  Subjective Assessment - 04/20/19 1556    Subjective  Pt reports that he is doing well today. No recent falls or loss of balance since last therapy session. He reports some R shoulder soreness immediately following the last session but none since. No specific questions or concerns at this time.    Pertinent History  Pt is here for imbalance, R shoulder pain, and L flank pain. 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 He has been having imbalance for multiple years but has never had any therapy for this issue. He ambulates using a cane or walking stick in his RUE. He also has a rolling walker but doesn't use it. He endorses 2-3 falls in the last 6 months which tipically occur laterally or as a result of tripping over his feet. In addition he also has been having R shoulder pain for the last 2-3 months. He believes that the pain might have started after he fell on his R shoulder but he is unsure. He is also complaining of L flank  pain today but this was not discussed in depth. He was recently started on Chantix for the ataxia. Pt reports that he also has a history of sleep apnea. Denies red flag symptoms.    Limitations  Walking    How long can you walk comfortably?  No limitations    Diagnostic tests  MRI, see history in evaluation    Patient Stated Goals  Improve balance, relieve L rib pain, decrease numbness/tingling in legs, improve quality of life    Currently in Pain?  No/denies         [facility] PT Assessment - 04/20/19 1616      Standardized Balance Assessment   Standardized Balance Assessment  Berg Balance Test;Dynamic Gait Index      Berg Balance Test   Sit to Stand  Able to stand without using hands and stabilize independently    Standing Unsupported  Able to stand safely 2 minutes    Sitting with Back Unsupported but Feet Supported on Floor or Stool  Able to sit safely and securely 2 minutes    Stand to Sit  Sits safely with minimal use of hands    Transfers  Able to transfer safely, minor use of hands    Standing Unsupported with Eyes Closed  Able to stand 10 seconds with supervision    Standing Unsupported with Feet Together  Able to place  feet together independently but unable to hold for 30 seconds    From Standing, Reach Forward with Outstretched Arm  Can reach confidently >25 cm (10")    From Standing Position, Pick up Object from [place] to pick up shoe safely and easily    From Standing Position, Turn to Look Behind Over each Shoulder  Looks behind from both sides and weight shifts well    Turn 360 Degrees  Able to turn 360 degrees safely but slowly    Standing Unsupported, Alternately Place Feet on Step/Stool  Able to complete >2 steps/needs minimal assist    Standing Unsupported, One Foot in Front  Able to take small step independently and hold 30 seconds    Standing on One Leg  Tries to lift leg/unable to hold 3 seconds but remains standing independently    Total Score  43      Dynamic  Gait Index   Level Surface  Mild Impairment    Change in Gait Speed  Mild Impairment    Gait with Horizontal Head Turns  Mild Impairment    Gait with Vertical Head Turns  Severe Impairment    Gait and Pivot Turn  Normal    Step Over Obstacle  Mild Impairment    Step Around Obstacles  Normal    Steps  Mild Impairment    Total Score  16         TREATMENT   Ther-ex  Completed outcome measures with patient including: DGI: 16/24 (16/24); BERG: 43/56 (44/56); 5TSTS: 11.4s (11.7s); ABC: 63.125% (50%, initially 64.4%) QuickDASH: 15.9% (27.3%) Worst shoulder pain: 4/10 (decreased frequency of shoulder pain, was daily now once/week, decreased duration of pain); Updated HEP with patient and performed: Seated W's with red tband x 10; Seated dynamic hugs with green tband x 10; Seated R shoulder abduction with red tband x 10;   Pt educated throughout session about proper posture and technique with exercises. Improved exercise technique, movement at target joints, use of target muscles after min to mod verbal, visual, tactile cues.   Updated outcome measures and goals with patient today. His DGI and BERG are unchanged from when they were last completed. His ABC is also unchanged and was 63.125% today compared to 64.4% at initial evaluation. His 5TSTS has improved from initial evaluation and was 11.4s today indicating improvement in LE strength. His shoulder has also improved with his quickDASH decreasing from 27.3% at last update to 15.9% today. His worst pain in his R shoulder is still a 4/10 however he reports decreased frequency and duration of pain when it occurs. Overall pt believes that he has noticed a slight improvement in his balance and he has an independent program to continue at home. Therapy will end formal balance training and will instead focus attention moving forward on his R shoulder pain. Pt will continue to benefit from skilled PT services to address deficits in R  shoulder pain and weakness in order to resume pain-free function at home.                        PT Short Term Goals - 04/20/19 1558      PT SHORT TERM GOAL #1   Title  Pt will be independent with HEP in order to improve strength and balance as well as strengthen R shoulder in order to decrease fall risk and improve pain-free function at home and work.    Time  6  Period  Weeks    Status  Achieved    Target Date  03/10/19        PT Long Term Goals - 04/20/19 1558      PT LONG TERM GOAL #1   Title  Pt will improve BERG by at least 3 points in order to demonstrate clinically significant improvement in balance.    Baseline  01/27/19: 44/56; 03/18/19: 44/56; 04/20/19: 43/56    Time  12    Period  Weeks    Status  Not Met      PT LONG TERM GOAL #2   Title  Pt will improve DGI by at least 3 points in order to demonstrate clinically significant improvement in balance and decreased risk for falls.    Baseline  01/27/19: 14/24; 03/18/19: 16/24; 04/21/19: 16/24    Time  12    Period  Weeks    Status  Not Met      PT LONG TERM GOAL #3   Title  Pt will improve ABC by at least 13% in order to demonstrate clinically significant improvement in balance confidence.    Baseline  01/27/19: 64.4%; 03/18/19: 50%; 04/20/19: 63.125%    Time  12    Period  Weeks    Status  Not Met      PT LONG TERM GOAL #4   Title  Pt will decrease 5TSTS by at least 3 seconds in order to demonstrate clinically significant improvement in LE strength.    Baseline  14.8 seconds; 03/18/19: 11.7s; 04/20/19: 11.4s    Time  12    Period  Weeks    Status  Achieved      PT LONG TERM GOAL #5   Title  Pt will decrease quick DASH score by at least 8% in order to demonstrate clinically significant reduction in disability.    Baseline  01/27/19: 20.5%; 03/18/19: 27.3%; 04/20/19: 15.9%    Time  12    Period  Weeks    Status  Partially Met    Target Date  06/15/19      PT LONG TERM GOAL #6   Title  Pt  will decrease worst R shoulder pain as reported on NPRS by at least 3 points in order to demonstrate clinically significant reduction in pain.    Baseline  01/27/19: 5/10; 03/18/19: 4/10; 04/20/19: 4/10 (decreased frequency from daily to once/week, decreased duration of pain);    Time  12    Period  Weeks    Status  Partially Met    Target Date  06/15/19            Plan - 04/20/19 1558    Clinical Impression Statement  Updated outcome measures and goals with patient today. His DGI and BERG are unchanged from when they were last completed. His ABC is also unchanged and was 63.125% today compared to 64.4% at initial evaluation. His 5TSTS has improved from initial evaluation and was 11.4s today indicating improvement in LE strength. His shoulder has also improved with his quickDASH decreasing from 27.3% at last update to 15.9% today. His worst pain in his R shoulder is still a 4/10 however he reports decreased frequency and duration of pain when it occurs. Overall pt believes that he has noticed a slight improvement in his balance and he has an independent program to continue at home. Therapy will end formal balance training and will instead focus attention moving forward on his R shoulder pain. Pt will continue to benefit from  skilled PT services to address deficits in R shoulder pain and weakness in order to resume pain-free function at home.    Personal Factors and Comorbidities  Comorbidity 1;Fitness;Time since onset of injury/illness/exacerbation    Comorbidities  Depression    Examination-Activity Limitations  Carry;Lift;Stairs;Caring for Others    Examination-Participation Restrictions  Community Activity;Shop;Yard Work    Journalist, newspaper    Rehab Potential  Fair    PT Frequency  2x / week    PT Duration  8 weeks    PT Treatment/Interventions  ADLs/Self Care Home Management;Aquatic Therapy;Canalith Repostioning;Cryotherapy;Electrical  Stimulation;Iontophoresis 40m/ml Dexamethasone;Moist Heat;Traction;Ultrasound;DME Instruction;Gait training;Stair training;Functional mobility training;Therapeutic activities;Therapeutic exercise;Balance training;Neuromuscular re-education;Patient/family education;Manual techniques;Dry needling;Vestibular;Spinal Manipulations;Joint Manipulations    PT Next Visit Plan  R shoulder STM, manual techniques, and strengthening    PT Home Exercise Plan  [email or web address]    Consulted and Agree with Plan of Care  Patient       Patient will benefit from skilled therapeutic intervention in order to improve the following deficits and impairments:  Abnormal gait, Decreased balance, Decreased strength, Difficulty walking, Pain  Visit Diagnosis: Unsteadiness on feet  Chronic right shoulder pain     Problem List There are no problems to display for this patient.  JPhillips GroutPT, DPT, GCS  Danny Cooper 04/21/2019, 2:59 PM  [place] [facility] [address] [place] [address]Phone: [phone]  Fax:  [phone] Name: AYehonatan GrandisonMRN: [id number] of Birth: [birth year]

## 2019-04-20 NOTE — Patient Instructions (Signed)
Access Code: [email or web address]  URL: [email or web address]  Date: 04/20/2019  Prepared by: Roxana Hires   Exercises Seated Shoulder Abduction with Resistance - 10 reps - 2 sets - 3s hold - 2x daily - 7x weekly Dynamic Hug on [employer] with Resistance - 10 reps - 2 sets - 3s hold - 2x daily - 7x weekly Seated Shoulder W External Rotation on Swiss Ball - 10 reps - 2 sets - 3s hold - 1x daily - 7x weekly Sit to Stand without Arm Support - 10 reps - 2 sets - 2x daily - 7x weekly Romberg Stance with Head Rotation - 3 reps - 30s hold - 2x daily - 7x weekly Single Leg Stance - 3 reps - 30s x 3 on each leg hold - 2x daily - 7x weekly

## 2019-04-21 NOTE — Addendum Note (Signed)
Addended by: Roxana Hires D on: 04/21/2019 04:18 PM   Modules accepted: Orders

## 2019-04-27 ENCOUNTER — Other Ambulatory Visit: Payer: Self-pay

## 2019-04-27 ENCOUNTER — Ambulatory Visit: Payer: BLUE CROSS/BLUE SHIELD

## 2019-04-27 DIAGNOSIS — R2681 Unsteadiness on feet: Secondary | ICD-10-CM | POA: Diagnosis not present

## 2019-04-27 DIAGNOSIS — M25511 Pain in right shoulder: Secondary | ICD-10-CM

## 2019-04-27 DIAGNOSIS — G8929 Other chronic pain: Secondary | ICD-10-CM

## 2019-04-27 NOTE — Therapy (Signed)
[place] MAIN [facility] SERVICES [address] [place], [place], [address] Phone: [phone]   Fax:  [phone]  Physical Therapy Treatment  Patient Details  Name: Danny Cooper MRN: [id number] Date of Birth: Dec 26, 1980 Referring Provider (PT): Dr. Manuella Ghazi   Encounter Date: 04/27/2019  PT End of Session - 04/28/19 0828    Visit Number  18    Number of Visits  41    Date for PT Re-Evaluation  06/15/19    Authorization Type  eval 01/28/19, progress note 03/18/19    PT Start Time  1600    PT Stop Time  1645    PT Time Calculation (min)  45 min    Equipment Utilized During Treatment  Gait belt    Activity Tolerance  Patient tolerated treatment well    Behavior During Therapy  [facility] for tasks assessed/performed       History reviewed. No pertinent past medical history.  History reviewed. No pertinent surgical history.  There were no vitals filed for this visit.  Subjective Assessment - 04/27/19 1555    Subjective  Pt reports that he is doing well today. He reports very mild intermittent shoulder pain since last session but "nothing consistent." No specific questions or concerns at this time.    Pertinent History  Pt is here for imbalance, R shoulder pain, and L flank pain. 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 He has been having imbalance for multiple years but has never had any therapy for this issue. He ambulates using a cane or walking stick in his RUE. He also has a rolling walker but doesn't use it. He endorses 2-3 falls in the last 6 months which tipically occur laterally or as a result of tripping over his feet. In addition he also has been having R shoulder pain for the last 2-3 months. He believes that the pain might have started after he fell on his R shoulder but he is unsure. He is also complaining of L flank pain today but this was not discussed in depth. He was recently started on  Chantix for the ataxia. Pt reports that he also has a history of sleep apnea. Denies red flag symptoms.    Limitations  Walking    How long can you walk comfortably?  No limitations    Diagnostic tests  MRI, see history in evaluation    Patient Stated Goals  Improve balance, relieve L rib pain, decrease numbness/tingling in legs, improve quality of life    Currently in Pain?  No/denies           TREATMENT   Ther-ex UBE x 4 minutes forward for warm-up during history (2 minutes unbilled); Supine isometric bicep curl 20s hold at 3 positions in range of motion elbow flexion, performed twice; Supine serratus punch with manual resistance x 10;   Manual Therapy Supine R shoulder A/P mobilizations at neutral grade III, 30s/bout x 3 bouts; Supine R shoulder A/P mobilizations at 90 flexion pressing through elbow, grade III, 30s/bout x 3 bouts; Cross body R shoulder posterior capsule stretch 2 x 30s; Supine R shoulder inferior and posterior mobilizations at available end range flexion, grade III, 30s/bout x 3 bouts; Supine R shoulder inferior mobilizations at 90 abduction grade III, 30s/bout x 2 bouts; Gentle R shoulder distraction mobilizations, grade I-II, 30s/bout x 2 bouts; Extensive IASTM to R anterior, lateral, and posterior deltoid as well as pec major, upper trap, supraspinatus, and infraspinatus, also  utilized trigger point release especially in anterior deltoid while moving R shoulder through PROM flexion/extension as well as internal and external rotation;   Trigger Point Dry Needling (TDN), unbilled Education performed with patient regarding potential benefit of TDN. Reviewed precautions and risks with patient. Pt provided verbal consent to treatment. TDN performed toR anterior deltoid 2,0.30x 60 single needle placements. Local twitch responses(LTR) during both placements. Pistoning technique utilized.    Pt educated throughout session about proper posture and  technique with exercises. Improved exercise technique, movement at target joints, use of target muscles after min to mod verbal, visual, tactile cues.   Isometric R bicep strengthening continued today as well as focus on serratus anterior. TDN performed again today with focus over trigger points in anterior deltoid. Extensive IASTM to R anterior, lateral, and posterior deltoid as well as pec major, upper trap, supraspinatus, and infraspinatus. Also utilized trigger point release especially in anterior deltoid while moving R shoulder through PROM flexion/extension as well as internal and external rotation. Pt demonstrates improvement in pain-free R shoulder flexion and abduction following mobilizations and soft tissue techniques at end of session. Pt will continue to benefit from skilled PT services to address deficits in balance, strength, and pain in order to prevent future falls and to improve function at home.                      PT Short Term Goals - 04/20/19 1558      PT SHORT TERM GOAL #1   Title  Pt will be independent with HEP in order to improve strength and balance as well as strengthen R shoulder in order to decrease fall risk and improve pain-free function at home and work.    Time  6    Period  Weeks    Status  Achieved    Target Date  03/10/19        PT Long Term Goals - 04/20/19 1558      PT LONG TERM GOAL #1   Title  Pt will improve BERG by at least 3 points in order to demonstrate clinically significant improvement in balance.    Baseline  01/27/19: 44/56; 03/18/19: 44/56; 04/20/19: 43/56    Time  12    Period  Weeks    Status  Not Met      PT LONG TERM GOAL #2   Title  Pt will improve DGI by at least 3 points in order to demonstrate clinically significant improvement in balance and decreased risk for falls.    Baseline  01/27/19: 14/24; 03/18/19: 16/24; 04/21/19: 16/24    Time  12    Period  Weeks    Status  Not Met      PT LONG TERM GOAL #3    Title  Pt will improve ABC by at least 13% in order to demonstrate clinically significant improvement in balance confidence.    Baseline  01/27/19: 64.4%; 03/18/19: 50%; 04/20/19: 63.125%    Time  12    Period  Weeks    Status  Not Met      PT LONG TERM GOAL #4   Title  Pt will decrease 5TSTS by at least 3 seconds in order to demonstrate clinically significant improvement in LE strength.    Baseline  14.8 seconds; 03/18/19: 11.7s; 04/20/19: 11.4s    Time  12    Period  Weeks    Status  Achieved      PT LONG TERM GOAL #5  Title  Pt will decrease quick DASH score by at least 8% in order to demonstrate clinically significant reduction in disability.    Baseline  01/27/19: 20.5%; 03/18/19: 27.3%; 04/20/19: 15.9%    Time  12    Period  Weeks    Status  Partially Met    Target Date  06/15/19      PT LONG TERM GOAL #6   Title  Pt will decrease worst R shoulder pain as reported on NPRS by at least 3 points in order to demonstrate clinically significant reduction in pain.    Baseline  01/27/19: 5/10; 03/18/19: 4/10; 04/20/19: 4/10 (decreased frequency from daily to once/week, decreased duration of pain);    Time  12    Period  Weeks    Status  Partially Met    Target Date  06/15/19            Plan - 04/28/19 0240    Clinical Impression Statement  Isometric R bicep strengthening continued today as well as focus on serratus anterior. TDN performed again today with focus over trigger points in anterior deltoid. Extensive IASTM to R anterior, lateral, and posterior deltoid as well as pec major, upper trap, supraspinatus, and infraspinatus. Also utilized trigger point release especially in anterior deltoid while moving R shoulder through PROM flexion/extension as well as internal and external rotation. Pt demonstrates improvement in pain-free R shoulder flexion and abduction following mobilizations and soft tissue techniques at end of session. Pt will continue to benefit from skilled PT  services to address deficits in balance, strength, and pain in order to prevent future falls and to improve function at home.    Personal Factors and Comorbidities  Comorbidity 1;Fitness;Time since onset of injury/illness/exacerbation    Comorbidities  Depression    Examination-Activity Limitations  Carry;Lift;Stairs;Caring for Others    Examination-Participation Restrictions  Community Activity;Shop;Yard Work    Journalist, newspaper    Rehab Potential  Fair    PT Frequency  2x / week    PT Duration  8 weeks    PT Treatment/Interventions  ADLs/Self Care Home Management;Aquatic Therapy;Canalith Repostioning;Cryotherapy;Electrical Stimulation;Iontophoresis '4mg'$ /ml Dexamethasone;Moist Heat;Traction;Ultrasound;DME Instruction;Gait training;Stair training;Functional mobility training;Therapeutic activities;Therapeutic exercise;Balance training;Neuromuscular re-education;Patient/family education;Manual techniques;Dry needling;Vestibular;Spinal Manipulations;Joint Manipulations    PT Next Visit Plan  R shoulder STM, manual techniques, and strengthening    PT Home Exercise Plan  [email or web address]    Consulted and Agree with Plan of Care  Patient       Patient will benefit from skilled therapeutic intervention in order to improve the following deficits and impairments:  Abnormal gait, Decreased balance, Decreased strength, Difficulty walking, Pain  Visit Diagnosis: Unsteadiness on feet  Chronic right shoulder pain     Problem List There are no problems to display for this patient.  Phillips Grout PT, DPT, GCS  Nikan Ellingson 04/28/2019, 9:10 AM  [place] MAIN [facility] SERVICES [address] [place], [place], [address] Phone: [phone]   Fax:  [phone]  Name: Kiara Keep MRN: [id number] Date of Birth: Aug 25, 1980

## 2019-04-29 ENCOUNTER — Other Ambulatory Visit: Payer: Self-pay

## 2019-04-29 ENCOUNTER — Ambulatory Visit: Payer: BLUE CROSS/BLUE SHIELD

## 2019-04-29 DIAGNOSIS — R2681 Unsteadiness on feet: Secondary | ICD-10-CM | POA: Diagnosis not present

## 2019-04-29 DIAGNOSIS — G8929 Other chronic pain: Secondary | ICD-10-CM

## 2019-04-29 NOTE — Therapy (Signed)
[place] MAIN [facility] SERVICES [address] [place], [place], [address] Phone: [phone]   Fax:  [phone]  Physical Therapy Treatment  Patient Details  Name: Danny Cooper MRN: [id number] Date of Birth: 11-20-1980 Referring Provider (PT): Dr. Manuella Ghazi   Encounter Date: 04/29/2019  PT End of Session - 04/29/19 1751    Visit Number  19    Number of Visits  41    Date for PT Re-Evaluation  06/15/19    Authorization Type  eval 01/28/19, progress note 03/18/19    PT Start Time  1600    PT Stop Time  1645    PT Time Calculation (min)  45 min    Equipment Utilized During Treatment  Gait belt    Activity Tolerance  Patient tolerated treatment well    Behavior During Therapy  [facility] for tasks assessed/performed       History reviewed. No pertinent past medical history.  History reviewed. No pertinent surgical history.  There were no vitals filed for this visit.  Subjective Assessment - 04/29/19 1751    Subjective  Pt reports that he is doing well today. He denies any shoulder pain since last therapy session. No post visit soreness per pt report. No specific questions or concerns at this time.    Pertinent History  Pt is here for imbalance, R shoulder pain, and L flank pain. 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 He has been having imbalance for multiple years but has never had any therapy for this issue. He ambulates using a cane or walking stick in his RUE. He also has a rolling walker but doesn't use it. He endorses 2-3 falls in the last 6 months which tipically occur laterally or as a result of tripping over his feet. In addition he also has been having R shoulder pain for the last 2-3 months. He believes that the pain might have started after he fell on his R shoulder but he is unsure. He is also complaining of L flank pain today but this was not discussed in depth. He was recently started on  Chantix for the ataxia. Pt reports that he also has a history of sleep apnea. Denies red flag symptoms.    Limitations  Walking    How long can you walk comfortably?  No limitations    Diagnostic tests  MRI, see history in evaluation    Patient Stated Goals  Improve balance, relieve L rib pain, decrease numbness/tingling in legs, improve quality of life    Currently in Pain?  No/denies         TREATMENT   Ther-ex UBE x 4 minutes forward for warm-up during history (2 minutes unbilled); Supine isometric R bicep curl20s hold at 3 positions in range of motion elbow flexion, performed twice; Supine R serratus punch with manual resistance 2 x 10; Supine R shoulder rythymic stabilization 30s x 2; Supine R shoulder flexion with 2# dumbell x 30;   Manual Therapy Supine R shoulder A/P mobilizations at neutral grade III, 30s/bout x 3 bouts; Supine R shoulder A/P mobilizations at 90 flexion pressing through elbow, grade III, 30s/bout x 3 bouts; Cross body R shoulder posterior capsule stretch 2 x 30s; Supine R shoulder inferior and posterior mobilizations at available end range flexion, grade III, 30s/bout x 3 bouts; Supine R shoulder inferior mobilizations at 90 abduction grade III, 30s/bout x 2 bouts; Gentle R shoulder distraction mobilizations, grade I-II, 30s/bout x 2 bouts;  Extensive IASTM to R anterior, lateral, and posterior deltoid as well as pec major, upper trap, supraspinatus, and infraspinatus, also utilized trigger point release especially in anterior deltoid while moving R shoulder through PROM flexion/extension as well as internal and external rotation;   Trigger Point Dry Needling (TDN), unbilled Education performed with patient regarding potential benefit of TDN. Reviewed precautions and risks with patient. Pt provided verbal consent to treatment. TDN performed toRanterior deltoid 2,0.30x60 single needle placements. Local twitch responses(LTR)during both  placements. Pistoning technique utilized.    Pt educated throughout session about proper posture and technique with exercises. Improved exercise technique, movement at target joints, use of target muscles after min to mod verbal, visual, tactile cues.   Added additional strengthening into session today. Pt denies any pain during exercise today. He initially has some pain at end range flexion and abduction PROM however that resolves at end of session. Extensive IASTM to R anterior, lateral, and posterior deltoid as well as pec major, upper trap, supraspinatus, and infraspinatus. Pt will continue to benefit from skilled PT services to address deficits in balance, strength, and pain in order to prevent future falls and to improve function at home.                        PT Short Term Goals - 04/20/19 1558      PT SHORT TERM GOAL #1   Title  Pt will be independent with HEP in order to improve strength and balance as well as strengthen R shoulder in order to decrease fall risk and improve pain-free function at home and work.    Time  6    Period  Weeks    Status  Achieved    Target Date  03/10/19        PT Long Term Goals - 04/20/19 1558      PT LONG TERM GOAL #1   Title  Pt will improve BERG by at least 3 points in order to demonstrate clinically significant improvement in balance.    Baseline  01/27/19: 44/56; 03/18/19: 44/56; 04/20/19: 43/56    Time  12    Period  Weeks    Status  Not Met      PT LONG TERM GOAL #2   Title  Pt will improve DGI by at least 3 points in order to demonstrate clinically significant improvement in balance and decreased risk for falls.    Baseline  01/27/19: 14/24; 03/18/19: 16/24; 04/21/19: 16/24    Time  12    Period  Weeks    Status  Not Met      PT LONG TERM GOAL #3   Title  Pt will improve ABC by at least 13% in order to demonstrate clinically significant improvement in balance confidence.    Baseline  01/27/19: 64.4%;  03/18/19: 50%; 04/20/19: 63.125%    Time  12    Period  Weeks    Status  Not Met      PT LONG TERM GOAL #4   Title  Pt will decrease 5TSTS by at least 3 seconds in order to demonstrate clinically significant improvement in LE strength.    Baseline  14.8 seconds; 03/18/19: 11.7s; 04/20/19: 11.4s    Time  12    Period  Weeks    Status  Achieved      PT LONG TERM GOAL #5   Title  Pt will decrease quick DASH score by at least 8% in order to  demonstrate clinically significant reduction in disability.    Baseline  01/27/19: 20.5%; 03/18/19: 27.3%; 04/20/19: 15.9%    Time  12    Period  Weeks    Status  Partially Met    Target Date  06/15/19      PT LONG TERM GOAL #6   Title  Pt will decrease worst R shoulder pain as reported on NPRS by at least 3 points in order to demonstrate clinically significant reduction in pain.    Baseline  01/27/19: 5/10; 03/18/19: 4/10; 04/20/19: 4/10 (decreased frequency from daily to once/week, decreased duration of pain);    Time  12    Period  Weeks    Status  Partially Met    Target Date  06/15/19            Plan - 04/29/19 1751    Clinical Impression Statement  Added additional strengthening into session today. Pt denies any pain during exercise today. He initially has some pain at end range flexion and abduction PROM however that resolves at end of session. Extensive IASTM to R anterior, lateral, and posterior deltoid as well as pec major, upper trap, supraspinatus, and infraspinatus. Pt will continue to benefit from skilled PT services to address deficits in balance, strength, and pain in order to prevent future falls and to improve function at home.    Personal Factors and Comorbidities  Comorbidity 1;Fitness;Time since onset of injury/illness/exacerbation    Comorbidities  Depression    Examination-Activity Limitations  Carry;Lift;Stairs;Caring for Others    Examination-Participation Restrictions  Community Activity;Shop;Yard Work     Journalist, newspaper    Rehab Potential  Fair    PT Frequency  2x / week    PT Duration  8 weeks    PT Treatment/Interventions  ADLs/Self Care Home Management;Aquatic Therapy;Canalith Repostioning;Cryotherapy;Electrical Stimulation;Iontophoresis 55m/ml Dexamethasone;Moist Heat;Traction;Ultrasound;DME Instruction;Gait training;Stair training;Functional mobility training;Therapeutic activities;Therapeutic exercise;Balance training;Neuromuscular re-education;Patient/family education;Manual techniques;Dry needling;Vestibular;Spinal Manipulations;Joint Manipulations    PT Next Visit Plan  R shoulder STM, manual techniques, and strengthening    PT Home Exercise Plan  [id number]    Consulted and Agree with Plan of Care  Patient       Patient will benefit from skilled therapeutic intervention in order to improve the following deficits and impairments:  Abnormal gait, Decreased balance, Decreased strength, Difficulty walking, Pain  Visit Diagnosis: Unsteadiness on feet  Chronic right shoulder pain     Problem List There are no problems to display for this patient.  JPhillips GroutPT, DPT, GCS  Karelly Dewalt 04/29/2019, 5:57 PM  [facility] [address]RG[address] [place] [address]Phone: [phone]  Fax:  [phone] Name: ARon BeskeMRN: [id number] of Birth: 5Aug 12, 1982

## 2019-05-03 ENCOUNTER — Ambulatory Visit: Payer: BLUE CROSS/BLUE SHIELD | Attending: Neurology

## 2019-05-03 ENCOUNTER — Other Ambulatory Visit: Payer: Self-pay

## 2019-05-03 DIAGNOSIS — M25511 Pain in right shoulder: Secondary | ICD-10-CM | POA: Diagnosis present

## 2019-05-03 DIAGNOSIS — G8929 Other chronic pain: Secondary | ICD-10-CM

## 2019-05-03 NOTE — Therapy (Signed)
[place] MAIN [facility] SERVICES [address] [place], [place], [address] Phone: [phone]   Fax:  [phone]  Physical Therapy Progress Note   Dates of reporting period  03/18/19   to   05/03/19  Patient Details  Name: Danny Cooper MRN: [id number] Date of Birth: Jul 10, 1980 Referring Provider (PT): Dr. Manuella Ghazi   Encounter Date: 05/03/2019  PT End of Session - 05/03/19 1659    Visit Number  20    Number of Visits  41    Date for PT Re-Evaluation  06/15/19    Authorization Type  eval 01/28/19, progress note 05/03/19    PT Start Time  0258    PT Stop Time  1730    PT Time Calculation (min)  40 min    Equipment Utilized During Treatment  Gait belt    Activity Tolerance  Patient tolerated treatment well    Behavior During Therapy  [facility] for tasks assessed/performed       History reviewed. No pertinent past medical history.  History reviewed. No pertinent surgical history.  There were no vitals filed for this visit.  Subjective Assessment - 05/03/19 1658    Subjective  Pt reports that he is doing well today. He had some R shoulder pain yesterday but no discernable aggravating factors. Pt reports that the ache eventually self-resolved. No post visit soreness after last session per pt report. No specific questions or concerns at this time.    Pertinent History  Pt is here for imbalance, R shoulder pain, and L flank pain. 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 He has been having imbalance for multiple years but has never had any therapy for this issue. He ambulates using a cane or walking stick in his RUE. He also has a rolling walker but doesn't use it. He endorses 2-3 falls in the last 6 months which tipically occur laterally or as a result of tripping over his feet. In addition he also has been having R shoulder pain for the last 2-3 months. He believes that the pain might have started after he fell  on his R shoulder but he is unsure. He is also complaining of L flank pain today but this was not discussed in depth. He was recently started on Chantix for the ataxia. Pt reports that he also has a history of sleep apnea. Denies red flag symptoms.    Limitations  Walking    How long can you walk comfortably?  No limitations    Diagnostic tests  MRI, see history in evaluation    Patient Stated Goals  Improve balance, relieve L rib pain, decrease numbness/tingling in legs, improve quality of life    Currently in Pain?  No/denies          TREATMENT   Ther-ex UBE x 4 minutes forward for warm-up during history(2 minutes unbilled); Supine R serratus punch with 3# dumbbell (DB) 2 x 10; Supine R shoulder rythymic stabilization with manual resistance from therapist 30s x 2; Supine R shoulder flexion to 90 with 3# DB 2 x 10;  L sidelying R shoulder abduction to 90 with 2# DB 2 x 10; L sidelying R shoulder ER with 2# DB 2 x 10, 3# DB for second set with pt reporting some mild anterior shoulder pain toward the end of second set; Seated R isometric bicep curl with 3# DB moving through different degrees of elbow extension 20s hold at 3 different positions, performed twice  with no reports of pain; Concentric bicep curl with 3# DB 2 x 10, no pain reported; Seated R shoulder flexion with 2# DB x 10;   Manual Therapy Supine R shoulder A/P mobilizations at neutral grade III, 30s/bout x 3 bouts; Supine R shoulder A/P mobilizations at 90 flexion pressing through elbow, grade III, 30s/bout x 3 bouts; Cross body R shoulder posterior capsule stretch2x 30s; Supine R shoulderinferior and posteriormobilizations at available end range flexion, gradeIII, 30s/bout x 3 bouts; Supine R shoulderinferiormobilizations at 90 abductiongrade III, 30s/bout x 2bouts; Supine R shoulder A/P mobilizations at 90 abduction and available end range ER, 30s/bout x 2 bouts; Gentle R shoulder distraction  mobilizations, grade I-II, 30s/bout x 2 bouts;   Pt educated throughout session about proper posture and technique with exercises. Improved exercise technique, movement at target joints, use of target muscles after min to mod verbal, visual, tactile cues.   Added additional strengthening into session today. Pt denies any pain during exercise. He is making good progress toward his goals. During last update his QuickDASH had improved and his worst pain had also decreased significantly. Progressed strengthening today to increase resistance and include more strengthening against gravity. Pt will continue to benefit from skilled PT services to address deficits in balance, strength, and pain in order to prevent future falls and to improve function at home.                       PT Short Term Goals - 05/03/19 2208      PT SHORT TERM GOAL #1   Title  Pt will be independent with HEP in order to improve strength and balance as well as strengthen R shoulder in order to decrease fall risk and improve pain-free function at home and work.    Time  6    Period  Weeks    Status  Achieved    Target Date  03/10/19        PT Long Term Goals - 05/03/19 2209      PT LONG TERM GOAL #1   Title  Pt will improve BERG by at least 3 points in order to demonstrate clinically significant improvement in balance.    Baseline  01/27/19: 44/56; 03/18/19: 44/56; 04/20/19: 43/56    Time  12    Period  Weeks    Status  Not Met      PT LONG TERM GOAL #2   Title  Pt will improve DGI by at least 3 points in order to demonstrate clinically significant improvement in balance and decreased risk for falls.    Baseline  01/27/19: 14/24; 03/18/19: 16/24; 04/21/19: 16/24    Time  12    Period  Weeks    Status  Not Met      PT LONG TERM GOAL #3   Title  Pt will improve ABC by at least 13% in order to demonstrate clinically significant improvement in balance confidence.    Baseline  01/27/19: 64.4%;  03/18/19: 50%; 04/20/19: 63.125%    Time  12    Period  Weeks    Status  Not Met      PT LONG TERM GOAL #4   Title  Pt will decrease 5TSTS by at least 3 seconds in order to demonstrate clinically significant improvement in LE strength.    Baseline  14.8 seconds; 03/18/19: 11.7s; 04/20/19: 11.4s    Time  12    Period  Weeks    Status  Achieved      PT LONG TERM GOAL #5   Title  Pt will decrease quick DASH score by at least 8% in order to demonstrate clinically significant reduction in disability.    Baseline  01/27/19: 20.5%; 03/18/19: 27.3%; 04/20/19: 15.9%    Time  12    Period  Weeks    Status  Partially Met    Target Date  06/15/19      PT LONG TERM GOAL #6   Title  Pt will decrease worst R shoulder pain as reported on NPRS by at least 3 points in order to demonstrate clinically significant reduction in pain.    Baseline  01/27/19: 5/10; 03/18/19: 4/10; 04/20/19: 4/10 (decreased frequency from daily to once/week, decreased duration of pain);    Time  12    Period  Weeks    Status  Partially Met    Target Date  06/15/19            Plan - 05/03/19 1659    Clinical Impression Statement  Added additional strengthening into session today. Pt denies any pain during exercise. He is making good progress toward his goals. During last update his QuickDASH had improved and his worst pain had also decreased significantly. Progressed strengthening today to increase resistance and include more strengthening against gravity. Pt will continue to benefit from skilled PT services to address deficits in balance, strength, and pain in order to prevent future falls and to improve function at home.    Personal Factors and Comorbidities  Comorbidity 1;Fitness;Time since onset of injury/illness/exacerbation    Comorbidities  Depression    Examination-Activity Limitations  Carry;Lift;Stairs;Caring for Others    Examination-Participation Restrictions  Community Activity;Shop;Yard Work     Journalist, newspaper    Rehab Potential  Fair    PT Frequency  2x / week    PT Duration  8 weeks    PT Treatment/Interventions  ADLs/Self Care Home Management;Aquatic Therapy;Canalith Repostioning;Cryotherapy;Electrical Stimulation;Iontophoresis '4mg'$ /ml Dexamethasone;Moist Heat;Traction;Ultrasound;DME Instruction;Gait training;Stair training;Functional mobility training;Therapeutic activities;Therapeutic exercise;Balance training;Neuromuscular re-education;Patient/family education;Manual techniques;Dry needling;Vestibular;Spinal Manipulations;Joint Manipulations    PT Next Visit Plan  R shoulder STM, manual techniques, and strengthening    PT Home Exercise Plan  [id number]    Consulted and Agree with Plan of Care  Patient       Patient will benefit from skilled therapeutic intervention in order to improve the following deficits and impairments:  Abnormal gait, Decreased balance, Decreased strength, Difficulty walking, Pain  Visit Diagnosis: Chronic right shoulder pain     Problem List There are no problems to display for this patient.  Phillips Grout PT, DPT, GCS  Jetta Murray 05/03/2019, 10:17 PM  [place] MAIN [facility] SERVICES [address] [place], [place], [address] Phone: [phone]   Fax:  [phone]  Name: Arles Rumbold MRN: [id number] Date of Birth: 1980/05/14

## 2019-05-05 ENCOUNTER — Ambulatory Visit: Payer: BLUE CROSS/BLUE SHIELD

## 2019-05-10 ENCOUNTER — Ambulatory Visit: Payer: BLUE CROSS/BLUE SHIELD

## 2019-05-12 ENCOUNTER — Ambulatory Visit: Payer: BLUE CROSS/BLUE SHIELD

## 2019-05-18 ENCOUNTER — Ambulatory Visit: Payer: BLUE CROSS/BLUE SHIELD

## 2019-05-20 ENCOUNTER — Ambulatory Visit: Payer: BLUE CROSS/BLUE SHIELD

## 2019-05-25 ENCOUNTER — Ambulatory Visit: Payer: BLUE CROSS/BLUE SHIELD

## 2019-05-27 ENCOUNTER — Ambulatory Visit: Payer: BLUE CROSS/BLUE SHIELD

## 2020-01-08 ENCOUNTER — Emergency Department: Payer: BLUE CROSS/BLUE SHIELD

## 2020-01-08 ENCOUNTER — Emergency Department
Admission: EM | Admit: 2020-01-08 | Discharge: 2020-01-08 | Disposition: A | Discharge status: Home / Self Care | Payer: BLUE CROSS/BLUE SHIELD | Attending: Student in an Organized Health Care Education/Training Program | Admitting: Student in an Organized Health Care Education/Training Program

## 2020-01-08 ENCOUNTER — Other Ambulatory Visit: Payer: Self-pay

## 2020-01-08 DIAGNOSIS — M25511 Pain in right shoulder: Secondary | ICD-10-CM | POA: Diagnosis present

## 2020-01-08 DIAGNOSIS — G114 Hereditary spastic paraplegia: Secondary | ICD-10-CM | POA: Diagnosis not present

## 2020-01-08 HISTORY — DX: Hereditary spastic paraplegia: G11.4

## 2020-01-08 MED ORDER — CYCLOBENZAPRINE HCL 5 MG PO TABS: 5.0000 mg | ORAL_TABLET | Freq: Three times a day (TID) | ORAL | 0 refills | Status: AC | PRN

## 2020-01-08 MED ORDER — IBUPROFEN 800 MG PO TABS: 800.0000 mg | ORAL_TABLET | Freq: Three times a day (TID) | ORAL | 0 refills | Status: AC | PRN

## 2020-01-08 NOTE — ED Provider Notes (Signed)
[facility] Emergency Department Provider Note ____________________________________________  Time seen: 76  I have reviewed the triage vital signs and the nursing notes.  HISTORY  Chief Complaint  Shoulder Pain (right)  HPI Danny Cooper is a 39 y.o. right-handed male with a history of chronic right shoulder pain, presents with acute injury and pain.  Patient with a history of bursitis of the right shoulder also reports a recent fall today, resulting in increased pain and disability.  He reports the diagnosis is not a formal one, and he has never had his intermittent shoulder pain worked-up. He denies any distal paresthesias, grip changes, or weakness.  He denies any other injury at this time.   Past Medical History:  Diagnosis Date  . Spastic ataxia, hereditary (HCC)     There are no problems to display for this patient.   Past Surgical History:  Procedure Laterality Date  . CARDIAC SURGERY     age 7    Prior to Admission medications   Medication Sig Start Date End Date Taking? Authorizing Provider  cyclobenzaprine (FLEXERIL) 5 MG tablet Take 1 tablet (5 mg total) by mouth 3 (three) times daily as needed. 01/08/20   Harriette Tovey, Charlesetta Ivory, PA-C  ibuprofen (ADVIL) 800 MG tablet Take 1 tablet (800 mg total) by mouth every 8 (eight) hours as needed. 01/08/20   Anaija Wissink, Charlesetta Ivory, PA-C  varenicline (CHANTIX) 0.5 MG tablet Take 0.5 mg by mouth 2 (two) times daily.    [provider]    Allergies Penicillins  History reviewed. No pertinent family history.  Social History Social History   Tobacco Use  . Smoking status: Never Smoker  . Smokeless tobacco: Never Used  Substance Use Topics  . Alcohol use: Not on file  . Drug use: Not on file    Review of Systems  Constitutional: Negative for fever. Cardiovascular: Negative for chest pain. Respiratory: Negative for shortness of breath. Musculoskeletal: Negative for back pain. Right  shoulder pain as above Skin: Negative for rash. Neurological: Negative for headaches, focal weakness or numbness. ____________________________________________  PHYSICAL EXAM:  VITAL SIGNS: ED Triage Vitals  Enc Vitals Group     BP 01/08/20 1801 128/65     Pulse Rate 01/08/20 1801 96     Resp 01/08/20 1801 18     Temp 01/08/20 1801 98 F (36.7 C)     Temp Source 01/08/20 1801 Oral     SpO2 01/08/20 1801 97 %     Weight 01/08/20 1757 247 lb (112 kg)     Height 01/08/20 1757 5\' 7"  (1.702 m)     Head Circumference --      Peak Flow --      Pain Score 01/08/20 1757 7     Pain Loc --      Pain Edu? --      Excl. in GC? --     Constitutional: Alert and oriented. Well appearing and in no distress. Head: Normocephalic and atraumatic. Eyes: Conjunctivae are normal. Normal extraocular movements Neck: Supple. Normal ROM.  Cardiovascular: Normal rate, regular rhythm. Normal distal pulses. Respiratory: Normal respiratory effort. No wheezes/rales/rhonchi. Gastrointestinal: Soft and nontender. No distention. Musculoskeletal: Right shoulder without obvious deformity or dislocation.  No sulcus sign is appreciated.  Patient with active range of motion although he describes some increase pain at the [facility] joint with extension above 90 degrees.  He has full internal and external rotation range and no rotator cuff deficit elicited on exam.  Nontender  with normal range of motion in all extremities.  Neurologic:  Normal gross sensation.  Cranial nerves II through XII grossly intact.  Normal UV DTRs bilaterally.  Normal speech and language. No gross focal neurologic deficits are appreciated. Skin:  Skin is warm, dry and intact. No rash noted. ____________________________________________   RADIOLOGY  DG Right Shoulder IMPRESSION: Unremarkable shoulder radiographs ____________________________________________  PROCEDURES  Procedures ____________________________________________  INITIAL IMPRESSION  / ASSESSMENT AND PLAN / ED COURSE  Patient with ED evaluation of acute right shoulder pain.  Describes a mechanical fall on the right side just prior to arrival.  No indication of any dislocation or deformity on exam.  X-ray negative for any acute fracture or dislocation.  Patient is discharged with a prescription for ibuprofen and Flexeril to take as directed.  He will follow-up with his primary provider or orthopedics for ongoing symptom management.  Danny Cooper was evaluated in Emergency Department on 01/08/2020 for the symptoms described in the history of present illness. He was evaluated in the context of the global COVID-19 pandemic, which necessitated consideration that the patient might be at risk for infection with the SARS-CoV-2 virus that causes COVID-19. Institutional protocols and algorithms that pertain to the evaluation of patients at risk for COVID-19 are in a state of rapid change based on information released by regulatory bodies including the CDC and federal and state organizations. These policies and algorithms were followed during the patient's care in the ED. ____________________________________________  FINAL CLINICAL IMPRESSION(S) / ED DIAGNOSES  Final diagnoses:  Acute pain of right shoulder      Karmen Stabs, Charlesetta Ivory, PA-C 01/08/20 2013    Willy Eddy, MD 01/08/20 2146

## 2020-01-08 NOTE — Discharge Instructions (Addendum)
Your exam and XR are normal at this time. Take the prescription meds as directed. Follow-up with Dr. Odis Luster in Ortho or your primary provider, for continued shoulder problems.

## 2020-01-08 NOTE — ED Triage Notes (Signed)
Pt states he has shoulder bursitis in his right shoulder but he fell today and right shoulder is painful to put pressure on and move

## 2020-01-08 NOTE — ED Notes (Signed)
Pt states he lost his balance and fell in his driveway, approx 3pm, does not remember if he hit his shoulder when he fell but it is hurting now.  With movement pain is 7/10, otherwise 0/10.  Pt has hx of chronic pain/bursitis in right shoulder.

## 2021-01-12 ENCOUNTER — Emergency Department
Admission: EM | Admit: 2021-01-12 | Discharge: 2021-01-13 | Disposition: A | Discharge status: Home / Self Care | Payer: BLUE CROSS/BLUE SHIELD | Attending: Emergency Medicine | Admitting: Emergency Medicine

## 2021-01-12 DIAGNOSIS — R509 Fever, unspecified: Secondary | ICD-10-CM | POA: Insufficient documentation

## 2021-01-12 DIAGNOSIS — R630 Anorexia: Secondary | ICD-10-CM | POA: Diagnosis not present

## 2021-01-12 DIAGNOSIS — R339 Retention of urine, unspecified: Secondary | ICD-10-CM | POA: Insufficient documentation

## 2021-01-12 DIAGNOSIS — R079 Chest pain, unspecified: Secondary | ICD-10-CM | POA: Diagnosis not present

## 2021-01-12 DIAGNOSIS — R109 Unspecified abdominal pain: Secondary | ICD-10-CM | POA: Insufficient documentation

## 2021-01-12 DIAGNOSIS — Z5321 Procedure and treatment not carried out due to patient leaving prior to being seen by health care provider: Secondary | ICD-10-CM | POA: Insufficient documentation

## 2021-01-12 LAB — COMPREHENSIVE METABOLIC PANEL
ALT: 16 U/L (ref 0–44)
AST: 21 U/L (ref 15–41)
Albumin: 4.1 g/dL (ref 3.5–5.0)
Alkaline Phosphatase: 61 U/L (ref 38–126)
Anion gap: 9 (ref 5–15)
BUN: 9 mg/dL (ref 6–20)
CO2: 25 mmol/L (ref 22–32)
Calcium: 8.7 mg/dL — ABNORMAL LOW (ref 8.9–10.3)
Chloride: 100 mmol/L (ref 98–111)
Creatinine, Ser: 0.74 mg/dL (ref 0.61–1.24)
GFR, Estimated: >60 mL/min (ref >60)
Glucose, Bld: 100 mg/dL — ABNORMAL HIGH (ref 70–99)
Potassium: 3.6 mmol/L (ref 3.5–5.1)
Sodium: 134 mmol/L — ABNORMAL LOW (ref 135–145)
Total Bilirubin: 0.6 mg/dL (ref 0.3–1.2)
Total Protein: 7.3 g/dL (ref 6.5–8.1)

## 2021-01-12 LAB — CBC
HCT: 38.8 % — ABNORMAL LOW (ref 39.0–52.0)
Hemoglobin: 12.7 g/dL — ABNORMAL LOW (ref 13.0–17.0)
MCH: 26.1 pg (ref 26.0–34.0)
MCHC: 32.7 g/dL (ref 30.0–36.0)
MCV: 79.8 fL — ABNORMAL LOW (ref 80.0–100.0)
Platelets: 285 10*3/uL (ref 150–400)
RBC: 4.86 MIL/uL (ref 4.22–5.81)
RDW: 14 % (ref 11.5–15.5)
WBC: 9.6 10*3/uL (ref 4.0–10.5)
nRBC: 0 % (ref 0.0–0.2)

## 2021-01-12 LAB — LIPASE, BLOOD: Lipase: 38 U/L (ref 11–51)

## 2021-01-12 MED ORDER — ACETAMINOPHEN 325 MG PO TABS
650.0000 mg | ORAL_TABLET | Freq: Once | ORAL | Status: AC | PRN
  Administered 2021-01-12: 650 mg via ORAL

## 2021-01-12 MED ORDER — ACETAMINOPHEN 325 MG PO TABS
ORAL_TABLET | ORAL | Status: AC
  Filled 2021-01-12: qty 2

## 2021-01-12 NOTE — ED Triage Notes (Addendum)
Pt c/o intermittent "spasms" on R abdomen and loss of appetite x 1 day.  Denies current pain.  Denies n/v/d.  Pt denies precipitating factors.   Pt reports urinating this morning.  He is unsure why EMS reported urinary retention.     Pt noted to be running a fever during triage.

## 2021-01-12 NOTE — ED Triage Notes (Signed)
First Nurse Note:  Arrives via [facility], c/o no urine output today  Last void last night.  VS wnl.

## 2021-01-13 ENCOUNTER — Encounter: Payer: Self-pay | Admitting: Emergency Medicine

## 2021-01-13 ENCOUNTER — Emergency Department
Admission: EM | Admit: 2021-01-13 | Discharge: 2021-01-13 | Disposition: A | Discharge status: Home / Self Care | Payer: BLUE CROSS/BLUE SHIELD | Source: Home / Self Care | Attending: Emergency Medicine | Admitting: Emergency Medicine

## 2021-01-13 ENCOUNTER — Other Ambulatory Visit: Payer: Self-pay

## 2021-01-13 ENCOUNTER — Emergency Department: Payer: BLUE CROSS/BLUE SHIELD

## 2021-01-13 DIAGNOSIS — R079 Chest pain, unspecified: Secondary | ICD-10-CM | POA: Insufficient documentation

## 2021-01-13 DIAGNOSIS — R1011 Right upper quadrant pain: Secondary | ICD-10-CM | POA: Insufficient documentation

## 2021-01-13 LAB — BASIC METABOLIC PANEL
Anion gap: 9 (ref 5–15)
BUN: 10 mg/dL (ref 6–20)
CO2: 27 mmol/L (ref 22–32)
Calcium: 8.8 mg/dL — ABNORMAL LOW (ref 8.9–10.3)
Chloride: 100 mmol/L (ref 98–111)
Creatinine, Ser: 0.76 mg/dL (ref 0.61–1.24)
GFR, Estimated: >60 mL/min (ref >60)
Glucose, Bld: 110 mg/dL — ABNORMAL HIGH (ref 70–99)
Potassium: 4.4 mmol/L (ref 3.5–5.1)
Sodium: 136 mmol/L (ref 135–145)

## 2021-01-13 LAB — CBC
HCT: 37.6 % — ABNORMAL LOW (ref 39.0–52.0)
Hemoglobin: 12.2 g/dL — ABNORMAL LOW (ref 13.0–17.0)
MCH: 26.6 pg (ref 26.0–34.0)
MCHC: 32.4 g/dL (ref 30.0–36.0)
MCV: 81.9 fL (ref 80.0–100.0)
Platelets: 268 10*3/uL (ref 150–400)
RBC: 4.59 MIL/uL (ref 4.22–5.81)
RDW: 14 % (ref 11.5–15.5)
WBC: 11 10*3/uL — ABNORMAL HIGH (ref 4.0–10.5)
nRBC: 0 % (ref 0.0–0.2)

## 2021-01-13 LAB — TROPONIN I (HIGH SENSITIVITY): Troponin I (High Sensitivity): 3 ng/L (ref <18)

## 2021-01-13 LAB — LIPASE, BLOOD: Lipase: 48 U/L (ref 11–51)

## 2021-01-13 MED ORDER — SENNOSIDES-DOCUSATE SODIUM 8.6-50 MG PO TABS: 2.0000 | ORAL_TABLET | Freq: Two times a day (BID) | ORAL | 0 refills | Status: AC

## 2021-01-13 MED ORDER — FAMOTIDINE 20 MG PO TABS: 20.0000 mg | ORAL_TABLET | Freq: Two times a day (BID) | ORAL | 0 refills | Status: AC

## 2021-01-13 NOTE — ED Notes (Signed)
Pt not visualized in lobby for vitals. Called x1

## 2021-01-13 NOTE — ED Provider Notes (Signed)
[facility] Emergency Department Provider Note  ____________________________________________  Time seen: Approximately 7:29 PM  I have reviewed the triage vital signs and the nursing notes.   HISTORY  Chief Complaint Abdominal Pain and Chest Pain    HPI Danny Cooper is a 40 y.o. male with no significant past medical history who complains of right-sided abdominal pain for the past 2 days which is intermittent, feels like spasm, nonradiating.  No shortness of breath or exertional symptoms.  Not pleuritic.  No cough fevers or chills.  No vomiting or diarrhea.  Eating normally.  Worse with movement, no alleviating factors.    Past Medical History:  Diagnosis Date   Spastic ataxia, hereditary (HCC)      There are no problems to display for this patient.    Past Surgical History:  Procedure Laterality Date   CARDIAC SURGERY     age 41     Prior to Admission medications   Medication Sig Start Date End Date Taking? Authorizing Provider  famotidine (PEPCID) 20 MG tablet Take 1 tablet (20 mg total) by mouth 2 (two) times daily. 01/13/21  Yes Sharman Cheek, MD  senna-docusate (SENOKOT-S) 8.6-50 MG tablet Take 2 tablets by mouth 2 (two) times daily. 01/13/21  Yes Sharman Cheek, MD  cyclobenzaprine (FLEXERIL) 5 MG tablet Take 1 tablet (5 mg total) by mouth 3 (three) times daily as needed. 01/08/20   Menshew, Charlesetta Ivory, PA-C  ibuprofen (ADVIL) 800 MG tablet Take 1 tablet (800 mg total) by mouth every 8 (eight) hours as needed. 01/08/20   Menshew, Charlesetta Ivory, PA-C  varenicline (CHANTIX) 0.5 MG tablet Take 0.5 mg by mouth 2 (two) times daily.    [provider]     Allergies Penicillins   No family history on file.  Social History Social History   Tobacco Use   Smoking status: Never   Smokeless tobacco: Never  Substance Use Topics   Alcohol use: Never   Drug use: Never    Review of Systems  Constitutional:   No fever or  chills.  ENT:   No sore throat. No rhinorrhea. Cardiovascular:   No chest pain or syncope. Respiratory:   No dyspnea or cough. Gastrointestinal:   Positive as above for abdominal pain without vomiting and diarrhea.  Musculoskeletal:   Negative for focal pain or swelling All other systems reviewed and are negative except as documented above in ROS and HPI.  ____________________________________________   PHYSICAL EXAM:  VITAL SIGNS: ED Triage Vitals  Enc Vitals Group     BP 01/13/21 1501 110/68     Pulse Rate 01/13/21 1501 76     Resp 01/13/21 1501 18     Temp 01/13/21 1501 98.6 F (37 C)     Temp Source 01/13/21 1501 Oral     SpO2 01/13/21 1501 97 %     Weight 01/13/21 1504 260 lb (117.9 kg)     Height --      Head Circumference --      Peak Flow --      Pain Score --      Pain Loc --      Pain Edu? --      Excl. in GC? --     Vital signs reviewed, nursing assessments reviewed.   Constitutional:   Alert and oriented. Non-toxic appearance.  Obese Eyes:   Conjunctivae are normal. EOMI. PERRL. ENT      Head:   Normocephalic and atraumatic.  Nose:   Wearing a mask.      Mouth/Throat:   Wearing a mask.      Neck:   No meningismus. Full ROM. Hematological/Lymphatic/Immunilogical:   No cervical lymphadenopathy. Cardiovascular:   RRR. Symmetric bilateral radial and DP pulses.  No murmurs. Cap refill less than 2 seconds. Respiratory:   Normal respiratory effort without tachypnea/retractions. Breath sounds are clear and equal bilaterally. No wheezes/rales/rhonchi. Gastrointestinal:   Soft and nontender. Non distended. There is no CVA tenderness.  No rebound, rigidity, or guarding. Genitourinary:   deferred Musculoskeletal:   Normal range of motion in all extremities. No joint effusions.  No lower extremity tenderness.  No edema. Neurologic:   Normal speech and language.  Motor grossly intact. No acute focal neurologic deficits are appreciated.  Skin:    Skin is warm, dry  and intact. No rash noted.  No petechiae, purpura, or bullae.  ____________________________________________    LABS (pertinent positives/negatives) (all labs ordered are listed, but only abnormal results are displayed) Labs Reviewed  BASIC METABOLIC PANEL - Abnormal; Notable for the following components:      Result Value   Glucose, Bld 110 (*)    Calcium 8.8 (*)    All other components within normal limits  CBC - Abnormal; Notable for the following components:   WBC 11.0 (*)    Hemoglobin 12.2 (*)    HCT 37.6 (*)    All other components within normal limits  LIPASE, BLOOD  TROPONIN I (HIGH SENSITIVITY)   ____________________________________________   EKG    ____________________________________________    RADIOLOGY  DG Chest 2 View  Result Date: 01/13/2021 CLINICAL DATA:  Right anterior chest pain for a day. EXAM: CHEST - 2 VIEW COMPARISON:  None. FINDINGS: The heart, hila, mediastinum, lungs, and pleura are unremarkable. No cause for right chest pain identified. IMPRESSION: No active cardiopulmonary disease. Electronically Signed   By: Gerome Sam III M.D.   On: 01/13/2021 15:55    ____________________________________________   PROCEDURES Procedures  ____________________________________________    CLINICAL IMPRESSION / ASSESSMENT AND PLAN / ED COURSE  Medications ordered in the ED: Medications - No data to display  Pertinent labs & imaging results that were available during my care of the patient were reviewed by me and considered in my medical decision making (see chart for details).  [name] was evaluated in Emergency Department on 01/13/2021 for the symptoms described in the history of present illness. He was evaluated in the context of the global COVID-19 pandemic, which necessitated consideration that the patient might be at risk for infection with the SARS-CoV-2 virus that causes COVID-19. Institutional protocols and algorithms that pertain to the  evaluation of patients at risk for COVID-19 are in a state of rapid change based on information released by regulatory bodies including the CDC and federal and state organizations. These policies and algorithms were followed during the patient's care in the ED.   Patient presents with right-sided abdominal pain.  Abdominal exam is completely benign.  Vital signs are normal.  No worrisome symptom features.  Exam is reassuring.  Labs are normal.  Stable for discharge, suggested trial of famotidine and Senokot to see if this helps with the symptoms, recommend follow-up with primary care.      ____________________________________________   FINAL CLINICAL IMPRESSION(S) / ED DIAGNOSES    Final diagnoses:  Right upper quadrant abdominal pain     ED Discharge Orders          Ordered  senna-docusate (SENOKOT-S) 8.6-50 MG tablet  2 times daily        01/13/21 1928    famotidine (PEPCID) 20 MG tablet  2 times daily        01/13/21 1928            Portions of this note were generated with dragon dictation software. Dictation errors may occur despite best attempts at proofreading.    Sharman Cheek, MD 01/13/21 [phone]

## 2021-01-13 NOTE — ED Provider Notes (Signed)
Emergency Medicine Provider Triage Evaluation Note  Danny Cooper , a 40 y.o. male  was evaluated in triage.  Pt complains of spasms from the right abdomen that progressed to the right chest yesterday. He has a history of spastic ataxia, and has occasional muscle spasms. He denies SOB, dyspnea, or diaphoresis.  Review of Systems  Positive: Chest wall pain Negative: FCS  Physical Exam  BP 110/68   Pulse 76   Temp 98.6 F (37 C) (Oral)   Resp 18   Wt 117.9 kg   SpO2 97%   BMI 40.72 kg/m  Gen:   Awake, no distress  NAD Resp:  Normal effort CTA MSK:   Moves extremities without difficulty  Other:  CV: RRR, no murmurs, rubs, or gallops  Medical Decision Making  Medically screening exam initiated at 3:17 PM.  Appropriate orders placed.  Charolett Bumpers was informed that the remainder of the evaluation will be completed by another provider, this initial triage assessment does not replace that evaluation, and the importance of remaining in the ED until their evaluation is complete.  Patient with ED evaluation of chest wall spasms and RUQ abdominal pain.    Lissa Hoard, PA-C 01/13/21 1626    Delton Prairie, MD 01/13/21 [phone]

## 2021-01-13 NOTE — ED Triage Notes (Signed)
Pt in with sharp RUQ pain that began Thursday, with pain moving into R upper chest since yesterday. Denies any n/v or sob. "Bodily functions" make the pain worse. Pain feels "spasm and sharp-like".

## 2021-01-31 IMAGING — MR MRI HEAD WITHOUT AND WITH CONTRAST
13 series · 48 of 48 positions shown · IV contrast (gadavist)
Comparison: None.

CLINICAL DATA: Ataxia. Balance disturbance over the last several
years with worsening dizziness over the last week.

EXAM:
MRI HEAD WITHOUT AND WITH CONTRAST
TECHNIQUE: Multiplanar, multiecho pulse sequences of the brain and surrounding
structures were obtained without and with intravenous contrast.
CONTRAST:  10 cc Gadavist

[Series 3: DWI · axial · 3.0mm · 1.20mm/px · z∈[-51,+117]mm · 4 of 57 slices shown (1 of 4)]
[im 1/57]
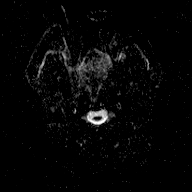
[im 19/57]
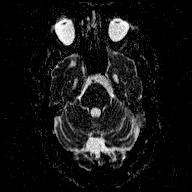
[im 38/57]
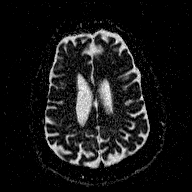
[im 57/57]
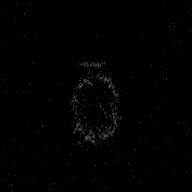

[Series 5: DWI · coronal · 3.0mm · 1.15mm/px · 4 of 55 slices shown (2 of 4)]
[im 1/55]
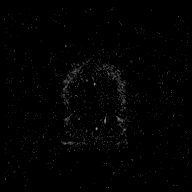
[im 19/55]
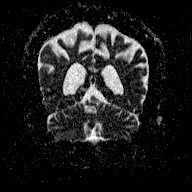
[im 37/55]
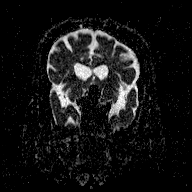
[im 55/55]
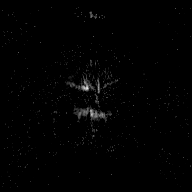

[Series 6: T1 · sagittal · 5.0mm · 0.45mm/px · 2 of 25 slices shown (1 of 2)]
[im 1/25]
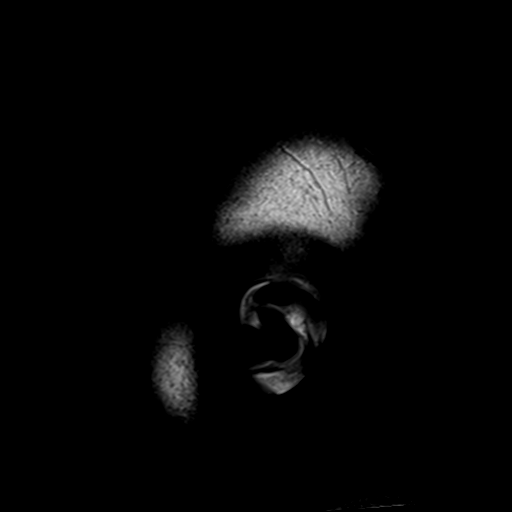
[im 25/25]
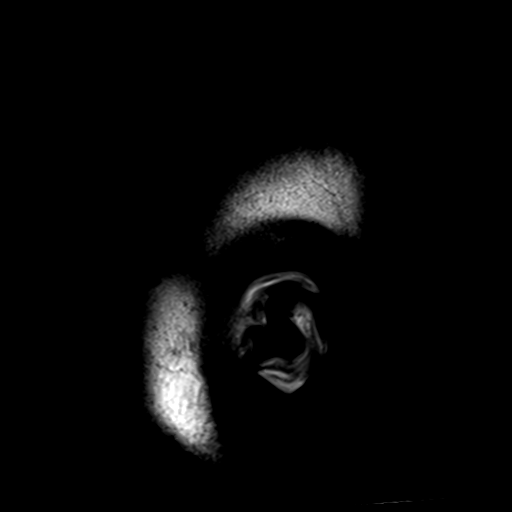

[Series 7: T2 · axial · 5.0mm · 0.72mm/px · z∈[-59,+123]mm · 2 of 27 slices shown (1 of 2)]
[im 1/27]
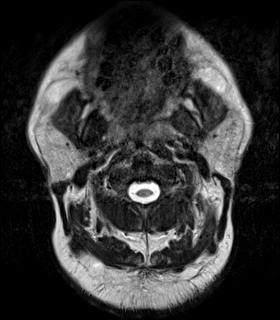
[im 27/27]
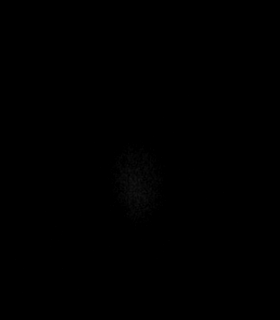

[Series 8: FLAIR · axial · 3.0mm · 0.45mm/px · z∈[-55,+119]mm · 3 of 59 slices shown (1 of 2)]
[im 1/59]
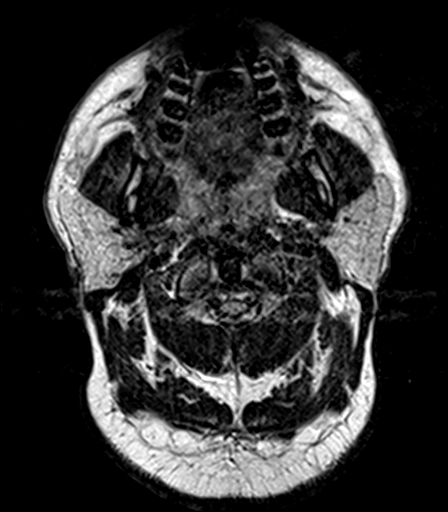
[im 30/59]
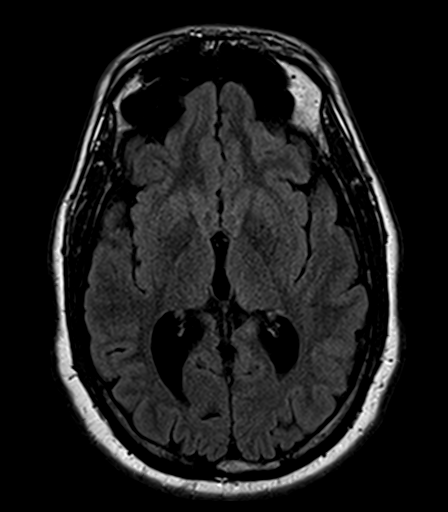
[im 59/59]
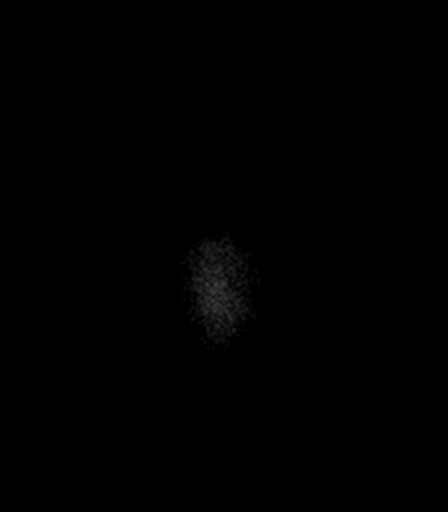

[Series 9: T1 · axial · 1.0mm · 1.00mm/px · z∈[-55,+120]mm · 10 of 176 slices shown (2 of 2)]
[im 1/176]
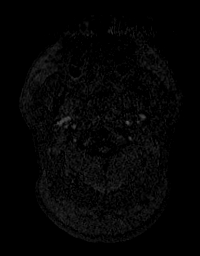
[im 20/176]
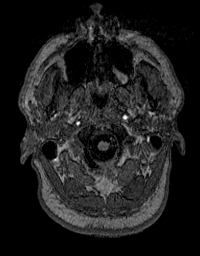
[im 39/176]
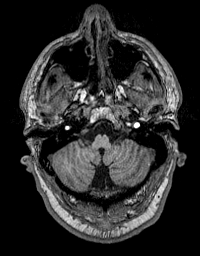
[im 59/176]
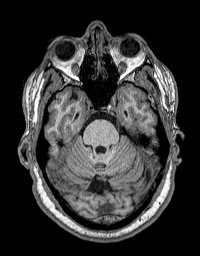
[im 78/176]
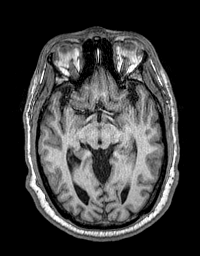
[im 98/176]
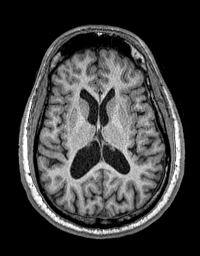
[im 117/176]
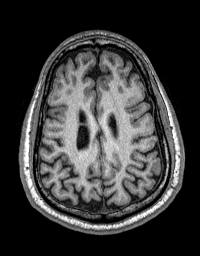
[im 137/176]
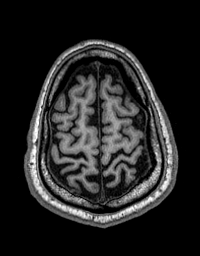
[im 156/176]
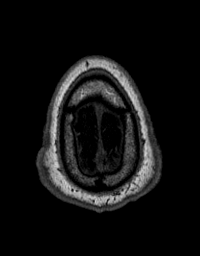
[im 176/176]
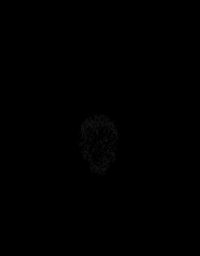

[Series 10: FLAIR · sagittal · 4.0mm · 0.45mm/px · 2 of 32 slices shown (2 of 2)]
[im 1/32]
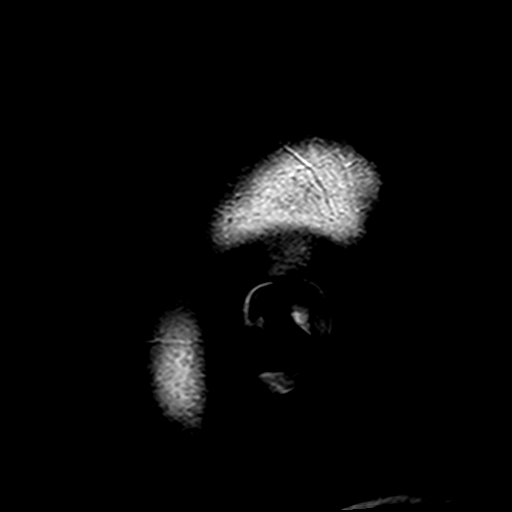
[im 32/32]
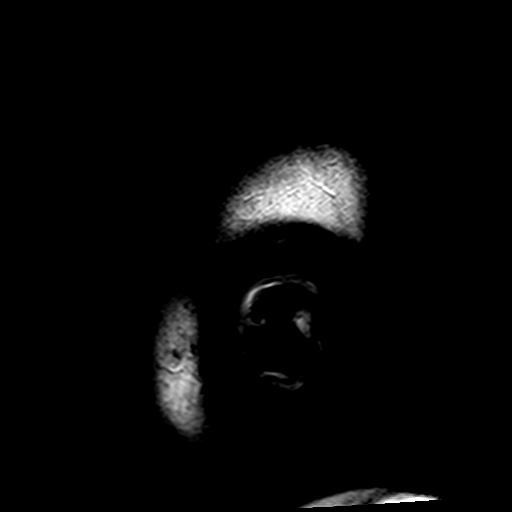

[Series 11: T2 · axial · 5.0mm · 0.72mm/px · 1 of 25 slices shown (2 of 2)]
[im 1/25]
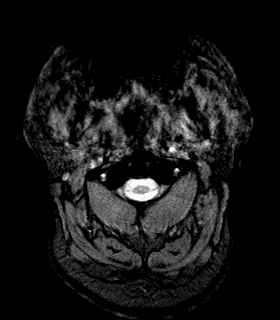

[Series 12: T2 post-contrast · coronal · 5.0mm · 0.43mm/px · 2 of 35 slices shown]
[im 1/35]
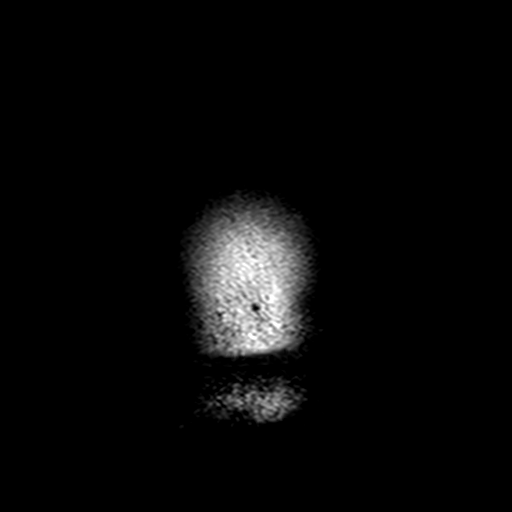
[im 35/35]
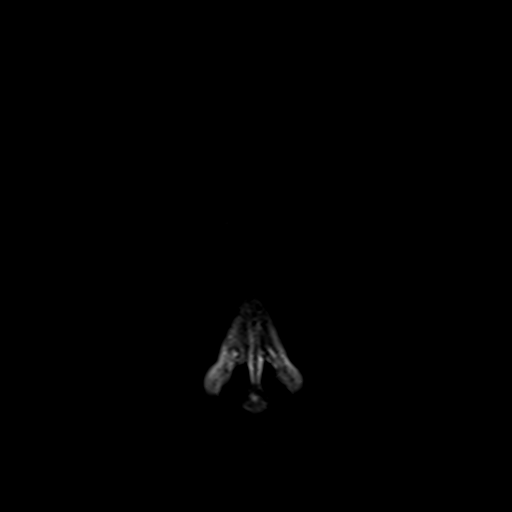

[Series 13: T1 post-contrast · axial · 1.0mm · 1.00mm/px · z∈[-55,+120]mm · 10 of 176 slices shown (1 of 2)]
[im 1/176]
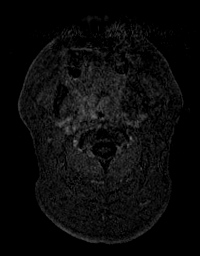
[im 20/176]
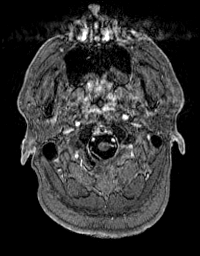
[im 39/176]
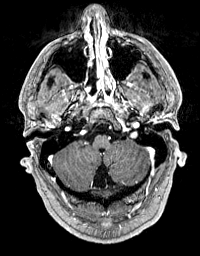
[im 59/176]
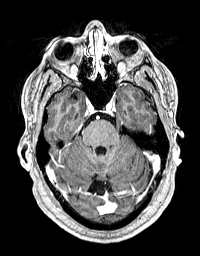
[im 78/176]
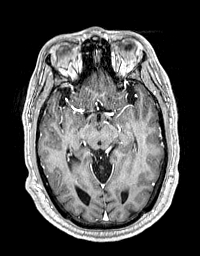
[im 98/176]
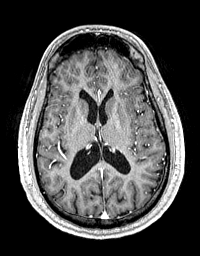
[im 117/176]
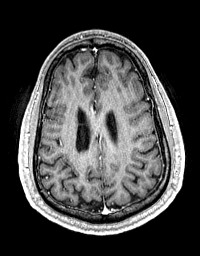
[im 137/176]
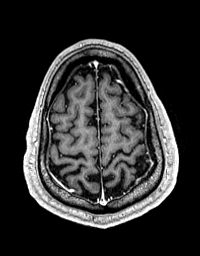
[im 156/176]
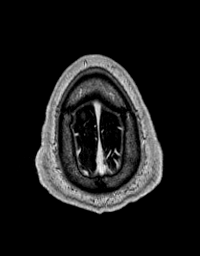
[im 176/176]
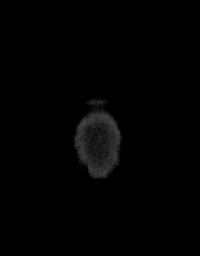

[Series 14: T1 post-contrast · coronal · 5.0mm · 0.43mm/px · 2 of 35 slices shown (2 of 2)]
[im 1/35]
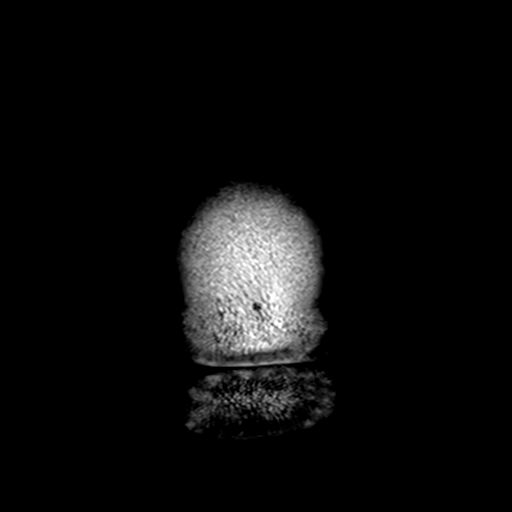
[im 35/35]
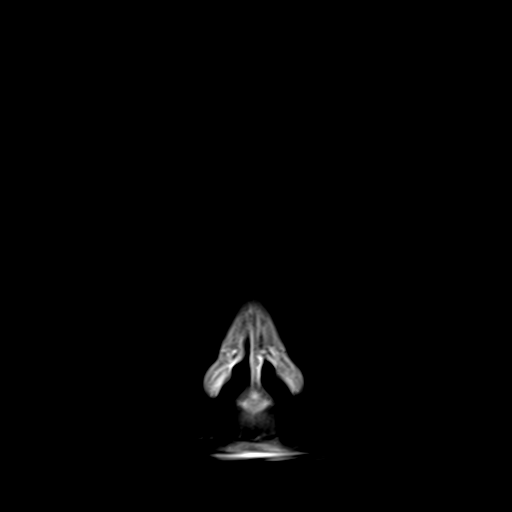

[Series 100: DWI · axial · 3.0mm · 1.20mm/px · z∈[-51,+117]mm · 3 of 57 slices shown (3 of 4)]
[im 1/57]
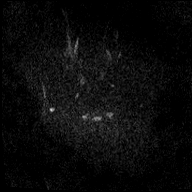
[im 29/57]
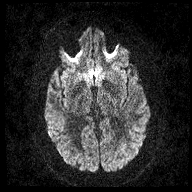
[im 57/57]
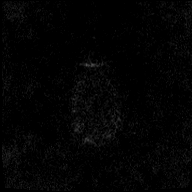

[Series 101: DWI · coronal · 3.0mm · 1.15mm/px · 3 of 55 slices shown (4 of 4)]
[im 1/55]
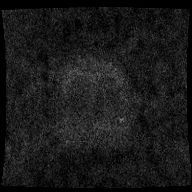
[im 28/55]
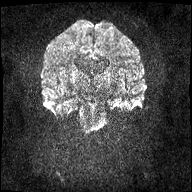
[im 55/55]
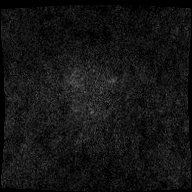

[48 of 48 positions shown; findings below may reference images not displayed]

FINDINGS: Brain: There is mild generalized volume loss, more than expected at
this age. The brain otherwise has a normal appearance without
evidence of malformation, old or acute small or large vessel
infarction, mass lesion, hemorrhage, hydrocephalus or extra-axial
collection. After contrast administration, no abnormal enhancement
occurs.

Vascular: Major vessels at the base of the brain show flow. Venous
sinuses appear patent.

Skull and upper cervical spine: Normal.

Sinuses/Orbits: Clear/normal.

Other: None significant.
IMPRESSION: No focal or acute finding. The brain does show generalized volume
loss, more than expected at this age.

## 2021-05-23 DIAGNOSIS — R69 Illness, unspecified: Secondary | ICD-10-CM | POA: Diagnosis not present

## 2021-05-30 DIAGNOSIS — R69 Illness, unspecified: Secondary | ICD-10-CM | POA: Diagnosis not present

## 2021-06-06 DIAGNOSIS — R69 Illness, unspecified: Secondary | ICD-10-CM | POA: Diagnosis not present

## 2021-06-13 DIAGNOSIS — R69 Illness, unspecified: Secondary | ICD-10-CM | POA: Diagnosis not present

## 2021-06-20 DIAGNOSIS — R69 Illness, unspecified: Secondary | ICD-10-CM | POA: Diagnosis not present

## 2021-06-27 DIAGNOSIS — R69 Illness, unspecified: Secondary | ICD-10-CM | POA: Diagnosis not present

## 2021-07-04 DIAGNOSIS — R69 Illness, unspecified: Secondary | ICD-10-CM | POA: Diagnosis not present

## 2021-07-05 DIAGNOSIS — Q249 Congenital malformation of heart, unspecified: Secondary | ICD-10-CM | POA: Diagnosis not present

## 2021-07-05 DIAGNOSIS — R69 Illness, unspecified: Secondary | ICD-10-CM | POA: Diagnosis not present

## 2021-07-05 DIAGNOSIS — Z131 Encounter for screening for diabetes mellitus: Secondary | ICD-10-CM | POA: Diagnosis not present

## 2021-07-05 DIAGNOSIS — Z0000 Encounter for general adult medical examination without abnormal findings: Secondary | ICD-10-CM | POA: Diagnosis not present

## 2021-07-05 DIAGNOSIS — Z130 Encounter for screening for diseases of the blood and blood-forming organs and certain disorders involving the immune mechanism: Secondary | ICD-10-CM | POA: Diagnosis not present

## 2021-07-05 DIAGNOSIS — G114 Hereditary spastic paraplegia: Secondary | ICD-10-CM | POA: Diagnosis not present

## 2021-07-05 DIAGNOSIS — G4733 Obstructive sleep apnea (adult) (pediatric): Secondary | ICD-10-CM | POA: Diagnosis not present

## 2021-07-05 DIAGNOSIS — E559 Vitamin D deficiency, unspecified: Secondary | ICD-10-CM | POA: Diagnosis not present

## 2021-07-05 DIAGNOSIS — Z13220 Encounter for screening for lipoid disorders: Secondary | ICD-10-CM | POA: Diagnosis not present

## 2021-07-11 DIAGNOSIS — R69 Illness, unspecified: Secondary | ICD-10-CM | POA: Diagnosis not present

## 2021-07-18 DIAGNOSIS — R69 Illness, unspecified: Secondary | ICD-10-CM | POA: Diagnosis not present

## 2021-07-25 DIAGNOSIS — R69 Illness, unspecified: Secondary | ICD-10-CM | POA: Diagnosis not present

## 2021-08-01 DIAGNOSIS — R69 Illness, unspecified: Secondary | ICD-10-CM | POA: Diagnosis not present

## 2021-08-08 DIAGNOSIS — R69 Illness, unspecified: Secondary | ICD-10-CM | POA: Diagnosis not present

## 2021-08-15 DIAGNOSIS — R69 Illness, unspecified: Secondary | ICD-10-CM | POA: Diagnosis not present

## 2021-08-22 DIAGNOSIS — R69 Illness, unspecified: Secondary | ICD-10-CM | POA: Diagnosis not present

## 2021-08-27 DIAGNOSIS — M79675 Pain in left toe(s): Secondary | ICD-10-CM | POA: Diagnosis not present

## 2021-08-27 DIAGNOSIS — G114 Hereditary spastic paraplegia: Secondary | ICD-10-CM | POA: Diagnosis not present

## 2021-08-27 DIAGNOSIS — Z9989 Dependence on other enabling machines and devices: Secondary | ICD-10-CM | POA: Diagnosis not present

## 2021-08-27 DIAGNOSIS — R69 Illness, unspecified: Secondary | ICD-10-CM | POA: Diagnosis not present

## 2021-08-27 DIAGNOSIS — G4733 Obstructive sleep apnea (adult) (pediatric): Secondary | ICD-10-CM | POA: Diagnosis not present

## 2021-08-27 DIAGNOSIS — R519 Headache, unspecified: Secondary | ICD-10-CM | POA: Diagnosis not present

## 2021-08-29 DIAGNOSIS — R69 Illness, unspecified: Secondary | ICD-10-CM | POA: Diagnosis not present

## 2021-09-05 DIAGNOSIS — R69 Illness, unspecified: Secondary | ICD-10-CM | POA: Diagnosis not present

## 2021-09-07 DIAGNOSIS — B351 Tinea unguium: Secondary | ICD-10-CM | POA: Diagnosis not present

## 2021-09-07 DIAGNOSIS — M79674 Pain in right toe(s): Secondary | ICD-10-CM | POA: Diagnosis not present

## 2021-09-07 DIAGNOSIS — M79675 Pain in left toe(s): Secondary | ICD-10-CM | POA: Diagnosis not present

## 2021-09-07 DIAGNOSIS — L600 Ingrowing nail: Secondary | ICD-10-CM | POA: Diagnosis not present

## 2021-09-12 DIAGNOSIS — R69 Illness, unspecified: Secondary | ICD-10-CM | POA: Diagnosis not present

## 2021-09-19 DIAGNOSIS — R69 Illness, unspecified: Secondary | ICD-10-CM | POA: Diagnosis not present

## 2021-09-26 DIAGNOSIS — R69 Illness, unspecified: Secondary | ICD-10-CM | POA: Diagnosis not present

## 2021-10-03 DIAGNOSIS — R69 Illness, unspecified: Secondary | ICD-10-CM | POA: Diagnosis not present

## 2021-10-10 DIAGNOSIS — R69 Illness, unspecified: Secondary | ICD-10-CM | POA: Diagnosis not present

## 2021-10-17 DIAGNOSIS — R69 Illness, unspecified: Secondary | ICD-10-CM | POA: Diagnosis not present

## 2021-10-19 DIAGNOSIS — M79674 Pain in right toe(s): Secondary | ICD-10-CM | POA: Diagnosis not present

## 2021-10-19 DIAGNOSIS — L600 Ingrowing nail: Secondary | ICD-10-CM | POA: Diagnosis not present

## 2021-10-19 DIAGNOSIS — M79675 Pain in left toe(s): Secondary | ICD-10-CM | POA: Diagnosis not present

## 2021-10-19 DIAGNOSIS — B351 Tinea unguium: Secondary | ICD-10-CM | POA: Diagnosis not present

## 2021-10-22 DIAGNOSIS — R69 Illness, unspecified: Secondary | ICD-10-CM | POA: Diagnosis not present

## 2021-10-22 DIAGNOSIS — Z13220 Encounter for screening for lipoid disorders: Secondary | ICD-10-CM | POA: Diagnosis not present

## 2021-10-22 DIAGNOSIS — Z130 Encounter for screening for diseases of the blood and blood-forming organs and certain disorders involving the immune mechanism: Secondary | ICD-10-CM | POA: Diagnosis not present

## 2021-10-22 DIAGNOSIS — R7303 Prediabetes: Secondary | ICD-10-CM | POA: Diagnosis not present

## 2021-10-22 DIAGNOSIS — G114 Hereditary spastic paraplegia: Secondary | ICD-10-CM | POA: Diagnosis not present

## 2021-10-24 DIAGNOSIS — R69 Illness, unspecified: Secondary | ICD-10-CM | POA: Diagnosis not present

## 2021-10-31 DIAGNOSIS — R69 Illness, unspecified: Secondary | ICD-10-CM | POA: Diagnosis not present

## 2021-11-07 DIAGNOSIS — R69 Illness, unspecified: Secondary | ICD-10-CM | POA: Diagnosis not present

## 2021-11-14 DIAGNOSIS — R69 Illness, unspecified: Secondary | ICD-10-CM | POA: Diagnosis not present

## 2021-11-21 DIAGNOSIS — F331 Major depressive disorder, recurrent, moderate: Secondary | ICD-10-CM | POA: Diagnosis not present

## 2021-11-27 DIAGNOSIS — Z9989 Dependence on other enabling machines and devices: Secondary | ICD-10-CM | POA: Diagnosis not present

## 2021-11-27 DIAGNOSIS — G114 Hereditary spastic paraplegia: Secondary | ICD-10-CM | POA: Diagnosis not present

## 2021-11-27 DIAGNOSIS — F32A Depression, unspecified: Secondary | ICD-10-CM | POA: Diagnosis not present

## 2021-11-27 DIAGNOSIS — R413 Other amnesia: Secondary | ICD-10-CM | POA: Diagnosis not present

## 2021-11-27 DIAGNOSIS — M79652 Pain in left thigh: Secondary | ICD-10-CM | POA: Diagnosis not present

## 2021-11-27 DIAGNOSIS — M542 Cervicalgia: Secondary | ICD-10-CM | POA: Diagnosis not present

## 2021-11-27 DIAGNOSIS — G4733 Obstructive sleep apnea (adult) (pediatric): Secondary | ICD-10-CM | POA: Diagnosis not present

## 2021-11-27 DIAGNOSIS — R519 Headache, unspecified: Secondary | ICD-10-CM | POA: Diagnosis not present

## 2021-11-28 DIAGNOSIS — F331 Major depressive disorder, recurrent, moderate: Secondary | ICD-10-CM | POA: Diagnosis not present

## 2021-12-05 DIAGNOSIS — F331 Major depressive disorder, recurrent, moderate: Secondary | ICD-10-CM | POA: Diagnosis not present

## 2021-12-12 DIAGNOSIS — F331 Major depressive disorder, recurrent, moderate: Secondary | ICD-10-CM | POA: Diagnosis not present

## 2021-12-26 DIAGNOSIS — F331 Major depressive disorder, recurrent, moderate: Secondary | ICD-10-CM | POA: Diagnosis not present

## 2022-01-03 DIAGNOSIS — F331 Major depressive disorder, recurrent, moderate: Secondary | ICD-10-CM | POA: Diagnosis not present

## 2022-01-28 DIAGNOSIS — L600 Ingrowing nail: Secondary | ICD-10-CM | POA: Diagnosis not present

## 2022-01-28 DIAGNOSIS — B351 Tinea unguium: Secondary | ICD-10-CM | POA: Diagnosis not present

## 2022-01-28 DIAGNOSIS — M79675 Pain in left toe(s): Secondary | ICD-10-CM | POA: Diagnosis not present

## 2022-01-28 DIAGNOSIS — M79674 Pain in right toe(s): Secondary | ICD-10-CM | POA: Diagnosis not present

## 2022-01-28 DIAGNOSIS — R234 Changes in skin texture: Secondary | ICD-10-CM | POA: Diagnosis not present

## 2022-02-13 DIAGNOSIS — F331 Major depressive disorder, recurrent, moderate: Secondary | ICD-10-CM | POA: Diagnosis not present

## 2022-02-19 DIAGNOSIS — F331 Major depressive disorder, recurrent, moderate: Secondary | ICD-10-CM | POA: Diagnosis not present

## 2022-02-27 DIAGNOSIS — F331 Major depressive disorder, recurrent, moderate: Secondary | ICD-10-CM | POA: Diagnosis not present

## 2022-03-06 DIAGNOSIS — F331 Major depressive disorder, recurrent, moderate: Secondary | ICD-10-CM | POA: Diagnosis not present

## 2022-03-13 DIAGNOSIS — F331 Major depressive disorder, recurrent, moderate: Secondary | ICD-10-CM | POA: Diagnosis not present

## 2022-03-25 DIAGNOSIS — F331 Major depressive disorder, recurrent, moderate: Secondary | ICD-10-CM | POA: Diagnosis not present

## 2022-04-02 DIAGNOSIS — F331 Major depressive disorder, recurrent, moderate: Secondary | ICD-10-CM | POA: Diagnosis not present

## 2022-04-15 DIAGNOSIS — Z130 Encounter for screening for diseases of the blood and blood-forming organs and certain disorders involving the immune mechanism: Secondary | ICD-10-CM | POA: Diagnosis not present

## 2022-04-15 DIAGNOSIS — R7303 Prediabetes: Secondary | ICD-10-CM | POA: Diagnosis not present

## 2022-04-15 DIAGNOSIS — F331 Major depressive disorder, recurrent, moderate: Secondary | ICD-10-CM | POA: Diagnosis not present

## 2022-04-15 DIAGNOSIS — Z13220 Encounter for screening for lipoid disorders: Secondary | ICD-10-CM | POA: Diagnosis not present

## 2022-04-16 DIAGNOSIS — E119 Type 2 diabetes mellitus without complications: Secondary | ICD-10-CM | POA: Diagnosis not present

## 2022-04-16 DIAGNOSIS — F32A Depression, unspecified: Secondary | ICD-10-CM | POA: Diagnosis not present

## 2022-04-16 DIAGNOSIS — G114 Hereditary spastic paraplegia: Secondary | ICD-10-CM | POA: Diagnosis not present

## 2022-04-16 DIAGNOSIS — G4733 Obstructive sleep apnea (adult) (pediatric): Secondary | ICD-10-CM | POA: Diagnosis not present

## 2022-04-16 DIAGNOSIS — R45851 Suicidal ideations: Secondary | ICD-10-CM | POA: Diagnosis not present

## 2022-05-08 DIAGNOSIS — F331 Major depressive disorder, recurrent, moderate: Secondary | ICD-10-CM | POA: Diagnosis not present

## 2022-05-28 DIAGNOSIS — F32A Depression, unspecified: Secondary | ICD-10-CM | POA: Diagnosis not present

## 2022-05-28 DIAGNOSIS — E119 Type 2 diabetes mellitus without complications: Secondary | ICD-10-CM | POA: Diagnosis not present

## 2022-05-28 DIAGNOSIS — F331 Major depressive disorder, recurrent, moderate: Secondary | ICD-10-CM | POA: Diagnosis not present

## 2022-06-04 DIAGNOSIS — F331 Major depressive disorder, recurrent, moderate: Secondary | ICD-10-CM | POA: Diagnosis not present

## 2022-06-18 DIAGNOSIS — F331 Major depressive disorder, recurrent, moderate: Secondary | ICD-10-CM | POA: Diagnosis not present

## 2022-07-02 DIAGNOSIS — F331 Major depressive disorder, recurrent, moderate: Secondary | ICD-10-CM | POA: Diagnosis not present

## 2022-07-16 DIAGNOSIS — F331 Major depressive disorder, recurrent, moderate: Secondary | ICD-10-CM | POA: Diagnosis not present

## 2022-07-17 DIAGNOSIS — E119 Type 2 diabetes mellitus without complications: Secondary | ICD-10-CM | POA: Diagnosis not present

## 2022-07-17 DIAGNOSIS — Z13220 Encounter for screening for lipoid disorders: Secondary | ICD-10-CM | POA: Diagnosis not present

## 2022-07-24 DIAGNOSIS — G114 Hereditary spastic paraplegia: Secondary | ICD-10-CM | POA: Diagnosis not present

## 2022-07-24 DIAGNOSIS — Z23 Encounter for immunization: Secondary | ICD-10-CM | POA: Diagnosis not present

## 2022-07-24 DIAGNOSIS — Z0000 Encounter for general adult medical examination without abnormal findings: Secondary | ICD-10-CM | POA: Diagnosis not present

## 2022-07-24 DIAGNOSIS — E119 Type 2 diabetes mellitus without complications: Secondary | ICD-10-CM | POA: Diagnosis not present

## 2022-07-24 DIAGNOSIS — G4733 Obstructive sleep apnea (adult) (pediatric): Secondary | ICD-10-CM | POA: Diagnosis not present

## 2022-07-24 DIAGNOSIS — F32A Depression, unspecified: Secondary | ICD-10-CM | POA: Diagnosis not present

## 2022-07-24 DIAGNOSIS — Z6841 Body Mass Index (BMI) 40.0 and over, adult: Secondary | ICD-10-CM | POA: Diagnosis not present

## 2022-07-30 DIAGNOSIS — F331 Major depressive disorder, recurrent, moderate: Secondary | ICD-10-CM | POA: Diagnosis not present

## 2022-08-06 DIAGNOSIS — F331 Major depressive disorder, recurrent, moderate: Secondary | ICD-10-CM | POA: Diagnosis not present

## 2022-08-13 DIAGNOSIS — F331 Major depressive disorder, recurrent, moderate: Secondary | ICD-10-CM | POA: Diagnosis not present

## 2022-08-20 DIAGNOSIS — F331 Major depressive disorder, recurrent, moderate: Secondary | ICD-10-CM | POA: Diagnosis not present

## 2022-08-27 DIAGNOSIS — F331 Major depressive disorder, recurrent, moderate: Secondary | ICD-10-CM | POA: Diagnosis not present

## 2022-09-10 DIAGNOSIS — F331 Major depressive disorder, recurrent, moderate: Secondary | ICD-10-CM | POA: Diagnosis not present

## 2022-09-25 DIAGNOSIS — F331 Major depressive disorder, recurrent, moderate: Secondary | ICD-10-CM | POA: Diagnosis not present

## 2022-10-01 DIAGNOSIS — F331 Major depressive disorder, recurrent, moderate: Secondary | ICD-10-CM | POA: Diagnosis not present

## 2022-10-07 DIAGNOSIS — F331 Major depressive disorder, recurrent, moderate: Secondary | ICD-10-CM | POA: Diagnosis not present

## 2022-10-22 DIAGNOSIS — F331 Major depressive disorder, recurrent, moderate: Secondary | ICD-10-CM | POA: Diagnosis not present

## 2022-10-29 DIAGNOSIS — F331 Major depressive disorder, recurrent, moderate: Secondary | ICD-10-CM | POA: Diagnosis not present

## 2022-11-05 DIAGNOSIS — F331 Major depressive disorder, recurrent, moderate: Secondary | ICD-10-CM | POA: Diagnosis not present

## 2022-11-13 DIAGNOSIS — F331 Major depressive disorder, recurrent, moderate: Secondary | ICD-10-CM | POA: Diagnosis not present

## 2022-11-15 DIAGNOSIS — E119 Type 2 diabetes mellitus without complications: Secondary | ICD-10-CM | POA: Diagnosis not present

## 2022-11-18 DIAGNOSIS — F331 Major depressive disorder, recurrent, moderate: Secondary | ICD-10-CM | POA: Diagnosis not present

## 2022-11-26 DIAGNOSIS — F331 Major depressive disorder, recurrent, moderate: Secondary | ICD-10-CM | POA: Diagnosis not present

## 2022-11-27 DIAGNOSIS — G114 Hereditary spastic paraplegia: Secondary | ICD-10-CM | POA: Diagnosis not present

## 2022-11-27 DIAGNOSIS — G4733 Obstructive sleep apnea (adult) (pediatric): Secondary | ICD-10-CM | POA: Diagnosis not present

## 2022-11-27 DIAGNOSIS — F32A Depression, unspecified: Secondary | ICD-10-CM | POA: Diagnosis not present

## 2022-11-27 DIAGNOSIS — R413 Other amnesia: Secondary | ICD-10-CM | POA: Diagnosis not present

## 2022-12-07 ENCOUNTER — Emergency Department
Admission: EM | Admit: 2022-12-07 | Discharge: 2022-12-07 | Disposition: A | Discharge status: Home / Self Care | Payer: 59 | Attending: Emergency Medicine | Admitting: Emergency Medicine

## 2022-12-07 ENCOUNTER — Emergency Department: Payer: 59

## 2022-12-07 ENCOUNTER — Encounter: Payer: Self-pay | Admitting: Emergency Medicine

## 2022-12-07 ENCOUNTER — Other Ambulatory Visit: Payer: Self-pay

## 2022-12-07 DIAGNOSIS — R350 Frequency of micturition: Secondary | ICD-10-CM | POA: Diagnosis not present

## 2022-12-07 DIAGNOSIS — J189 Pneumonia, unspecified organism: Secondary | ICD-10-CM | POA: Insufficient documentation

## 2022-12-07 DIAGNOSIS — R531 Weakness: Secondary | ICD-10-CM

## 2022-12-07 DIAGNOSIS — R42 Dizziness and giddiness: Secondary | ICD-10-CM | POA: Diagnosis not present

## 2022-12-07 DIAGNOSIS — J9811 Atelectasis: Secondary | ICD-10-CM | POA: Diagnosis not present

## 2022-12-07 DIAGNOSIS — R051 Acute cough: Secondary | ICD-10-CM

## 2022-12-07 DIAGNOSIS — R519 Headache, unspecified: Secondary | ICD-10-CM | POA: Diagnosis not present

## 2022-12-07 DIAGNOSIS — E119 Type 2 diabetes mellitus without complications: Secondary | ICD-10-CM | POA: Diagnosis not present

## 2022-12-07 DIAGNOSIS — R0989 Other specified symptoms and signs involving the circulatory and respiratory systems: Secondary | ICD-10-CM | POA: Diagnosis not present

## 2022-12-07 DIAGNOSIS — Z20822 Contact with and (suspected) exposure to covid-19: Secondary | ICD-10-CM | POA: Diagnosis not present

## 2022-12-07 LAB — URINALYSIS, ROUTINE W REFLEX MICROSCOPIC
Bilirubin Urine: NEGATIVE
Glucose, UA: NEGATIVE mg/dL
Hgb urine dipstick: NEGATIVE
Ketones, ur: NEGATIVE mg/dL
Leukocytes,Ua: NEGATIVE
Nitrite: NEGATIVE
Protein, ur: NEGATIVE mg/dL
Specific Gravity, Urine: 1.015 (ref 1.005–1.030)
pH: 5 (ref 5.0–8.0)

## 2022-12-07 LAB — CBC WITH DIFFERENTIAL/PLATELET
Abs Immature Granulocytes: 0.02 10*3/uL (ref 0.00–0.07)
Basophils Absolute: 0 10*3/uL (ref 0.0–0.1)
Basophils Relative: 0 %
Eosinophils Absolute: 0 10*3/uL (ref 0.0–0.5)
Eosinophils Relative: 0 %
HCT: 42.7 % (ref 39.0–52.0)
Hemoglobin: 13.7 g/dL (ref 13.0–17.0)
Immature Granulocytes: 0 %
Lymphocytes Relative: 13 %
Lymphs Abs: 0.8 10*3/uL (ref 0.7–4.0)
MCH: 26.5 pg (ref 26.0–34.0)
MCHC: 32.1 g/dL (ref 30.0–36.0)
MCV: 82.6 fL (ref 80.0–100.0)
Monocytes Absolute: 0.5 10*3/uL (ref 0.1–1.0)
Monocytes Relative: 8 %
Neutro Abs: 5.1 10*3/uL (ref 1.7–7.7)
Neutrophils Relative %: 79 %
Platelets: 148 10*3/uL — ABNORMAL LOW (ref 150–400)
RBC: 5.17 MIL/uL (ref 4.22–5.81)
RDW: 14.1 % (ref 11.5–15.5)
Smear Review: NORMAL
WBC: 6.5 10*3/uL (ref 4.0–10.5)
nRBC: 0 % (ref 0.0–0.2)

## 2022-12-07 LAB — COMPREHENSIVE METABOLIC PANEL
ALT: 27 U/L (ref 0–44)
AST: 29 U/L (ref 15–41)
Albumin: 4.3 g/dL (ref 3.5–5.0)
Alkaline Phosphatase: 64 U/L (ref 38–126)
Anion gap: 9 (ref 5–15)
BUN: 8 mg/dL (ref 6–20)
CO2: 26 mmol/L (ref 22–32)
Calcium: 9 mg/dL (ref 8.9–10.3)
Chloride: 101 mmol/L (ref 98–111)
Creatinine, Ser: 0.74 mg/dL (ref 0.61–1.24)
GFR, Estimated: >60 mL/min (ref >60)
Glucose, Bld: 136 mg/dL — ABNORMAL HIGH (ref 70–99)
Potassium: 3.9 mmol/L (ref 3.5–5.1)
Sodium: 136 mmol/L (ref 135–145)
Total Bilirubin: 0.7 mg/dL (ref 0.3–1.2)
Total Protein: 8.1 g/dL (ref 6.5–8.1)

## 2022-12-07 LAB — BLOOD GAS, VENOUS
Acid-Base Excess: 1.7 mmol/L (ref 0.0–2.0)
Bicarbonate: 27.7 mmol/L (ref 20.0–28.0)
O2 Saturation: 62.3 %
Patient temperature: 37
pCO2, Ven: 48 mmHg (ref 44–60)
pH, Ven: 7.37 (ref 7.25–7.43)
pO2, Ven: 38 mmHg (ref 32–45)

## 2022-12-07 LAB — SARS CORONAVIRUS 2 BY RT PCR: SARS Coronavirus 2 by RT PCR: NEGATIVE

## 2022-12-07 LAB — TROPONIN I (HIGH SENSITIVITY)
Troponin I (High Sensitivity): 3 ng/L (ref <18)
Troponin I (High Sensitivity): <2 ng/L (ref <18)

## 2022-12-07 MED ORDER — SODIUM CHLORIDE 0.9 % IV BOLUS
1000.0000 mL | Freq: Once | INTRAVENOUS | Status: AC
  Administered 2022-12-07: 1000 mL via INTRAVENOUS

## 2022-12-07 MED ORDER — CEFDINIR 300 MG PO CAPS: 300.0000 mg | ORAL_CAPSULE | Freq: Two times a day (BID) | ORAL | 0 refills | Status: AC

## 2022-12-07 MED ORDER — SODIUM CHLORIDE 0.9 % IV SOLN
2.0000 g | Freq: Once | INTRAVENOUS | Status: AC
  Administered 2022-12-07: 2 g via INTRAVENOUS
  Filled 2022-12-07: qty 20

## 2022-12-07 MED ORDER — AZITHROMYCIN 250 MG PO TABS: ORAL_TABLET | ORAL | 0 refills | Status: AC

## 2022-12-07 NOTE — ED Triage Notes (Signed)
C/O dizziness, falling asleep a lot, headaches x 2 days.  AAOx3.  Skin warm and dry. NAD

## 2022-12-07 NOTE — Discharge Instructions (Signed)
Take the antibiotics as prescribed  Try to move/walk as much as possible and sit upright in a chair when resting - for your lungs  Drink at least 6-8 glasses of water daily

## 2022-12-07 NOTE — ED Provider Notes (Signed)
[facility] Provider Note    Event Date/Time   First MD Initiated Contact with Patient 12/07/22 [phone]     (approximate)   History   Dizziness   HPI  Danny Cooper is a 42 y.o. male   With history of spastic ataxia, diabetes, here with multiple complaints.  The patient states his primary complaint is that over the last several days he has had progressively worsening generalized weakness, lightheadedness, and general weakness in his legs.  He states he has had some lightheadedness and sensation of dizziness with standing.  He does have poor appetite.  He states he has also had urinary frequency and feeling like he needs to urinate all the time despite not having much urine output.  He has felt very fatigued.  He has had difficulty getting around the house.  No focal numbness or weakness.  No medication changes although he does now reportedly receive a film for what medication he previously had been receiving a tablet.  He is not sure what this is.      Physical Exam   Triage Vital Signs: ED Triage Vitals  Encounter Vitals Group     BP 12/07/22 0921 132/79     Systolic BP Percentile --      Diastolic BP Percentile --      Pulse Rate 12/07/22 0921 (!) 103     Resp 12/07/22 0921 16     Temp 12/07/22 0921 98.8 F (37.1 C)     Temp Source 12/07/22 0921 Oral     SpO2 12/07/22 0921 94 %     Weight 12/07/22 0918 259 lb 14.8 oz (117.9 kg)     Height 12/07/22 0918 5\' 7"  (1.702 m)     Head Circumference --      Peak Flow --      Pain Score 12/07/22 0918 0     Pain Loc --      Pain Education --      Exclude from Growth Chart --     Most recent vital signs: Vitals:   12/07/22 0921 12/07/22 1213  BP: 132/79 103/63  Pulse: (!) 103 90  Resp: 16 16  Temp: 98.8 F (37.1 C)   SpO2: 94% 99%     General: Awake, no distress.  CV:  Good peripheral perfusion. RRR. No m/r/g. Resp:  Normal work of breathing. Lungs are clear bilaterally. Abd:  No distention. No  tenderness. Other:  Flat affect. MAE with 5/5 strength. CNII-XII intact. Sensation to light touch is intact. Mildly dry MM.   ED Results / Procedures / Treatments   Labs (all labs ordered are listed, but only abnormal results are displayed) Labs Reviewed  CBC WITH DIFFERENTIAL/PLATELET - Abnormal; Notable for the following components:      Result Value   Platelets 148 (*)    All other components within normal limits  COMPREHENSIVE METABOLIC PANEL - Abnormal; Notable for the following components:   Glucose, Bld 136 (*)    All other components within normal limits  URINALYSIS, ROUTINE W REFLEX MICROSCOPIC - Abnormal; Notable for the following components:   Color, Urine YELLOW (*)    APPearance CLEAR (*)    All other components within normal limits  SARS CORONAVIRUS 2 BY RT PCR  BLOOD GAS, VENOUS  TROPONIN I (HIGH SENSITIVITY)  TROPONIN I (HIGH SENSITIVITY)     EKG Normal sinus rhythm, VR 100. PR 160, QRS 102. QTc 446. No acute ST elevations or depressions. No ischemia  or infarct.   RADIOLOGY CT Head: NAICA CXR: Bibasilar atelectasis vs consolidation, mild cardiomegaly   I also independently reviewed and agree with radiologist interpretations.   PROCEDURES:  Critical Care performed: No  .1-3 Lead EKG Interpretation  Performed by: Shaune Pollack, MD Authorized by: Shaune Pollack, MD     Interpretation: normal     ECG rate:  90-100   ECG rate assessment: normal     Rhythm: sinus rhythm     Ectopy: none     Conduction: normal   Comments:     Indication: Weakness     MEDICATIONS ORDERED IN ED: Medications  sodium chloride 0.9 % bolus 1,000 mL (1,000 mLs Intravenous New Bag/Given 12/07/22 1200)  cefTRIAXone (ROCEPHIN) 2 g in sodium chloride 0.9 % 100 mL IVPB (2 g Intravenous New Bag/Given 12/07/22 1210)     IMPRESSION / MDM / ASSESSMENT AND PLAN / ED COURSE  I reviewed the triage vital signs and the nursing notes.                               Differential diagnosis includes, but is not limited to, occult infection (PNA, UTI), dehydration, sleep apnea/retention, polypharmacy, exacerbation of his underlying neuro condition  Patient's presentation is most consistent with acute presentation with potential threat to life or bodily function.  The patient is on the cardiac monitor to evaluate for evidence of arrhythmia and/or significant heart rate changes  42 yo M with h/o  spastic ataxia here with general weakness, lightheadedness. Suspect mild dehydration and likely early CAP, with +cough, bibasilar infiltrates on CXR. Satting >95% on RA and able to ambulate at his baseline. His labs are very reassuring - no retention on gas. No leukocytosis or anemia. CMP unremarkable. UA negative for UTI. Trop neg. EKG nonischemic. Pt feels better with fluids and is ambulating. Will tx for possible early CAP given +cough, mild SOB, general weakness and his comorbidities, with encouraged hydration and close f/u. No other apparent emergent medical pathology.   FINAL CLINICAL IMPRESSION(S) / ED DIAGNOSES   Final diagnoses:  Generalized weakness  Acute cough  Community acquired pneumonia, unspecified laterality     Rx / DC Orders   ED Discharge Orders          Ordered    azithromycin (ZITHROMAX Z-PAK) 250 MG tablet        12/07/22 1230    cefdinir (OMNICEF) 300 MG capsule  2 times daily        12/07/22 1230             Note:  This document was prepared using Dragon voice recognition software and may include unintentional dictation errors.   Shaune Pollack, MD 12/07/22 1240

## 2022-12-10 DIAGNOSIS — F331 Major depressive disorder, recurrent, moderate: Secondary | ICD-10-CM | POA: Diagnosis not present

## 2022-12-18 DIAGNOSIS — F331 Major depressive disorder, recurrent, moderate: Secondary | ICD-10-CM | POA: Diagnosis not present

## 2022-12-25 ENCOUNTER — Ambulatory Visit: Payer: 59 | Attending: Neurology

## 2022-12-25 DIAGNOSIS — R2681 Unsteadiness on feet: Secondary | ICD-10-CM | POA: Diagnosis not present

## 2022-12-25 DIAGNOSIS — R262 Difficulty in walking, not elsewhere classified: Secondary | ICD-10-CM | POA: Diagnosis not present

## 2022-12-25 DIAGNOSIS — R278 Other lack of coordination: Secondary | ICD-10-CM | POA: Insufficient documentation

## 2022-12-25 DIAGNOSIS — F331 Major depressive disorder, recurrent, moderate: Secondary | ICD-10-CM | POA: Diagnosis not present

## 2022-12-25 DIAGNOSIS — M6281 Muscle weakness (generalized): Secondary | ICD-10-CM | POA: Insufficient documentation

## 2022-12-25 NOTE — Therapy (Signed)
OUTPATIENT PHYSICAL THERAPY NEURO EVALUATION   Patient Name: Danny Cooper MRN: [id number] DOB:1980/06/01, 42 y.o., male Today's Date: 12/25/2022   PCP: [facility]  REFERRING PROVIDER:   Lonell Face, MD    END OF SESSION:  PT End of Session - 12/25/22 1551     Visit Number 1    Number of Visits 25    Date for PT Re-Evaluation 03/19/23    PT Start Time 1402    PT Stop Time 1445    PT Time Calculation (min) 43 min    Equipment Utilized During Treatment Gait belt    Activity Tolerance Patient tolerated treatment well    Behavior During Therapy WFL for tasks assessed/performed             Past Medical History:  Diagnosis Date   Diabetes mellitus without complication (HCC)    Spastic ataxia, hereditary (HCC)    Past Surgical History:  Procedure Laterality Date   CARDIAC SURGERY     age 82   There are no problems to display for this patient.   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   THERAPY DIAG:  Unsteadiness on feet  Difficulty in walking, not elsewhere classified  Other lack of coordination  Muscle weakness (generalized)  Rationale for Evaluation and Treatment: Rehabilitation  SUBJECTIVE:                                                                                                                                                                                             SUBJECTIVE STATEMENT:  Pt presents for PT eval  Pt accompanied by: self  PERTINENT HISTORY:   The pt is a pleasant 42 yo male with hereditary spastic paraplegia ambulating with upright walker referred for PT eval. Pt would like to improve his strength, mobility, balance and gait. He was previously seen in clinic years ago following diagnosis. Pt reports he returns now due to decline in his mobility that started about "a year to a year and a half ago." Pt uses a cane for short distances and uses his upright walker for  longer distances. Pt has had 3-4 falls (maybe more) in the last six months. He reports no serious injury with falls. Falls have occurred with standing up from sitting and from tripping over his feet.  Pt reports decreased endurance, where he can ambulate <7 minutes before needing to rest. He can stand with assistance for maybe 10-15 minutes. Pt was recently in ED 12/07/22 where he reports he was diagnoses with pneumonia. PMH consists of spastic ataxia, diabetes, seen ED earlier this  month with concerns of weakness and lightheadedness where he was found to have pneumonia, hx of R shoulder bursitis per pt report  PAIN:  Are you having pain? No not currently but does have low back pain  PRECAUTIONS: Fall  RED FLAGS: Bowel or bladder incontinence: Yes: reports urinary incontinence with progression of his condition      WEIGHT BEARING RESTRICTIONS: No  FALLS: Has patient fallen in last 6 months? Yes. Number of falls 3-4  LIVING ENVIRONMENT: Lives with: lives with their spouse and with adult step-son, and four y/o   Lives in: House/apartment Stairs: Yes: External: 4-5 steps; on right going up and on left going up Has following equipment at home: Single point cane, Walker - 2 wheeled, and [employer] - 4 wheeled  PLOF: Independent  PATIENT GOALS: improve mobility, strength, balance  OBJECTIVE:   DIAGNOSTIC FINDINGS:  Via chart  CT HEAD 12/07/22 " IMPRESSION: 1. No acute intracranial abnormalities. 2. Mild cerebral atrophy advanced for age again noted.     Electronically Signed   By: Trudie Reed M.D.   On: 12/07/2022 10:06"    COGNITION: Overall cognitive status: Within functional limits for tasks assessed   SENSATION: Pt reports he can have tingling in his limbs UE/LE, he reports he can have numbness in his feet and hands, rarely in arms/legs   MUSCLE TONE:has spasticity    POSTURE: rounded shoulders   COORDINATION: UE impaired with finger chin<>target UE impaired  alternating hand movement  LE slight pain limitation on R, likely limited due to mobility as well  LOWER EXTREMITY MMT:    BLE grossly 4+/5 - greatest deficits hip flexors and adductors   *pain felt in R hip flexor with hip flexion MMT   TRANSFERS: Assistive device utilized: Environmental consultant - 2 wheeled  Sit to stand: Modified independence Stand to sit: Modified independence Chair to chair: Modified independence     GAIT: Gait pattern:  impaired gait speed, ambulates with WBOS, forward posture Distance walked: clinic distances: , Assistive device utilized: Environmental consultant - 2 wheeled upwalker Level of assistance: SBA and CGA  FUNCTIONAL TESTS:  5 times sit to stand: 21.7 sec hands-free; pt reports difficulty with eccentric component at times must steady with CGA  6 minute walk test: 569 ft  10 meter walk test: 0.54 m/s with RW  exhibits ER and WBOS/unsteadiness and forward posture Berg deferred  PATIENT SURVEYS:  FOTO deferred due to system error  TODAY'S TREATMENT:                                                                                                                              DATE:   Initiated HEP: STS 2x5 at support surface Standing March with UE support 2x10     PATIENT EDUCATION: Education details: exam findings, goals, plan, HEP Person educated: Patient Education method: Programmer, multimedia, Demonstration, Verbal cues, and Handouts Education comprehension: verbalized understanding, returned demonstration, and needs further education  HOME EXERCISE  PROGRAM:  Access Code: [email or web address] URL: [email or web address] Date: 12/25/2022 Prepared by: Temple Pacini  Exercises - Sit to Stand with Counter Support  - 1 x daily - 5 x weekly - 4 sets - 5 reps - Standing March with Counter Support  - 1 x daily - 5 x weekly - 2 sets - 10 reps GOALS:  SHORT TERM GOALS: Target date: 02/05/2023      Patient will be independent in home exercise program to improve  strength/mobility for better functional independence with ADLs. Baseline: initiated Goal status: INITIAL   LONG TERM GOALS: Target date: 03/19/2023   Patient will increase FOTO score to equal to or greater than     to demonstrate statistically significant improvement in mobility and quality of life.  Baseline: deferred due to system error Goal status: INITIAL  2.  Patient (< 79 years old) will complete five times sit to stand test in < 10 seconds indicating increased LE strength and improved balance. Baseline: 21.7 sec Goal status: INITIAL  3.  Patient will increase Berg Balance score by > 6 points to demonstrate decreased fall risk during functional activities Baseline:  Goal status: INITIAL  4.  Patient will increase 10 meter walk test to >1.42m/s as to improve gait speed for better community ambulation and to reduce fall risk. Baseline: 0.54 m/s Goal status: INITIAL  5.  Patient will increase six minute walk test distance to at least 1000 ft for progression to community ambulator and improved gait ability Baseline:  569 ft Goal status: INITIAL     ASSESSMENT:  CLINICAL IMPRESSION: Patient is a pleasant 42 y.o. male who was seen today for physical therapy evaluation and treatment for impairments due to hereditary spastic paraplegia. Exam findings indicate increased fall risk, decreased strength, and impaired gait, balance, sensation, and endurance. Berg deferred to next visit due to time. Pt HEP also to be advance. The pt will benefit from further skilled PT to address these impairments in order to increase functional mobility and decrease fall risk.  OBJECTIVE IMPAIRMENTS: Abnormal gait, decreased activity tolerance, decreased balance, decreased coordination, decreased endurance, decreased mobility, difficulty walking, decreased strength, impaired sensation, impaired tone, improper body mechanics, postural dysfunction, and pain.   ACTIVITY LIMITATIONS: carrying, lifting,  standing, squatting, stairs, transfers, continence, and locomotion level  PARTICIPATION LIMITATIONS: cleaning, laundry, shopping, community activity, and yard work  PERSONAL FACTORS: Fitness, Time since onset of injury/illness/exacerbation, and 1-2 comorbidities: PMH consists of spastic ataxia, diabetes,hx of R shoulder bursitis per pt report  are also affecting patient's functional outcome.   REHAB POTENTIAL: Good  CLINICAL DECISION MAKING: Evolving/moderate complexity  EVALUATION COMPLEXITY: Moderate  PLAN:  PT FREQUENCY: 1-2x/week  PT DURATION: 12 weeks  PLANNED INTERVENTIONS: Therapeutic exercises, Therapeutic activity, Neuromuscular re-education, Balance training, Gait training, Patient/Family education, Self Care, Joint mobilization, Stair training, Vestibular training, Canalith repositioning, Visual/preceptual remediation/compensation, Orthotic/Fit training, DME instructions, Dry Needling, Electrical stimulation, Spinal mobilization, Cryotherapy, Moist heat, Taping, Ultrasound, Manual therapy, and Re-evaluation  PLAN FOR NEXT SESSION: BERG, advance HEP, strengthening, gait and balance   Baird Kay, PT 12/25/2022, 5:05 PM

## 2022-12-31 ENCOUNTER — Ambulatory Visit: Payer: 59 | Attending: Neurology

## 2022-12-31 DIAGNOSIS — R278 Other lack of coordination: Secondary | ICD-10-CM | POA: Insufficient documentation

## 2022-12-31 DIAGNOSIS — M6281 Muscle weakness (generalized): Secondary | ICD-10-CM | POA: Insufficient documentation

## 2022-12-31 DIAGNOSIS — R2681 Unsteadiness on feet: Secondary | ICD-10-CM | POA: Diagnosis not present

## 2022-12-31 DIAGNOSIS — R262 Difficulty in walking, not elsewhere classified: Secondary | ICD-10-CM | POA: Insufficient documentation

## 2022-12-31 NOTE — Therapy (Signed)
OUTPATIENT PHYSICAL THERAPY NEURO TREATMENT NOTE   Patient Name: Danny Cooper MRN: [id number] DOB:Jun 15, 1980, 42 y.o., male Today's Date: 12/31/2022   PCP: [facility]  REFERRING PROVIDER:   Lonell Face, MD    END OF SESSION:  PT End of Session - 12/31/22 1456     Visit Number 2    Number of Visits 25    Date for PT Re-Evaluation 03/19/23    PT Start Time 1406    PT Stop Time 1451    PT Time Calculation (min) 45 min    Equipment Utilized During Treatment Gait belt    Activity Tolerance Patient tolerated treatment well;Patient limited by fatigue    Behavior During Therapy WFL for tasks assessed/performed              Past Medical History:  Diagnosis Date   Diabetes mellitus without complication (HCC)    Spastic ataxia, hereditary (HCC)    Past Surgical History:  Procedure Laterality Date   CARDIAC SURGERY     age 54   There are no problems to display for this patient.   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 Reports his walker broke this morning and that one of the wheels came off. He did not fall. No other updates  Pt accompanied by: self  PERTINENT HISTORY:   The pt is a pleasant 42 yo male with hereditary spastic paraplegia ambulating with upright walker referred for PT eval. Pt would like to improve his strength, mobility, balance and gait. He was previously seen in clinic years ago following diagnosis. Pt reports he returns now due to  decline in his mobility that started about "a year to a year and a half ago." Pt uses a cane for short distances and uses his upright walker for longer distances. Pt has had 3-4 falls (maybe more) in the last six months. He reports no serious injury with falls. Falls have occurred with standing up from sitting and from tripping over his feet.  Pt reports decreased endurance, where he can ambulate <7 minutes before needing to rest. He can stand with assistance for maybe  10-15 minutes. Pt was recently in ED 12/07/22 where he reports he was diagnoses with pneumonia. PMH consists of spastic ataxia, diabetes, seen ED earlier this month with concerns of weakness and lightheadedness where he was found to have pneumonia, hx of R shoulder bursitis per pt report  PAIN:  Are you having pain? No not currently but does have low back pain  PRECAUTIONS: Fall  RED FLAGS: Bowel or bladder incontinence: Yes: reports urinary incontinence with progression of his condition      WEIGHT BEARING RESTRICTIONS: No  FALLS: Has patient fallen in last 6 months? Yes. Number of falls 3-4  LIVING ENVIRONMENT: Lives with: lives with their spouse and with adult step-son, and four y/o   Lives in: House/apartment Stairs: Yes: External: 4-5 steps; on right going up and on left going up Has following equipment at home: Single point cane, Walker - 2 wheeled, and [employer] - 4 wheeled  PLOF: Independent  PATIENT GOALS: improve mobility, strength, balance  OBJECTIVE:   DIAGNOSTIC FINDINGS:  Via chart  CT HEAD 12/07/22 " IMPRESSION: 1. No acute intracranial abnormalities. 2. Mild cerebral atrophy advanced for age again noted.     Electronically Signed   By: Trudie Reed M.D.   On: 12/07/2022 10:06"    COGNITION: Overall cognitive status: Within functional limits for tasks assessed   SENSATION: Pt reports he can have tingling in his limbs UE/LE, he reports he can have numbness in his feet and hands, rarely in  arms/legs   MUSCLE TONE:has spasticity    POSTURE: rounded shoulders   COORDINATION: UE impaired with finger chin<>target UE impaired alternating hand movement  LE slight pain limitation on R, likely limited due to mobility as well  LOWER EXTREMITY MMT:    BLE grossly 4+/5 - greatest deficits hip flexors and adductors   *pain felt in R hip flexor with hip flexion MMT   TRANSFERS: Assistive device utilized: Environmental consultant - 2 wheeled  Sit to stand: Modified independence Stand to sit: Modified independence Chair to chair: Modified independence     GAIT: Gait pattern:  impaired gait speed, ambulates with WBOS, forward posture Distance walked: clinic distances: , Assistive device utilized: Environmental consultant - 2 wheeled upwalker Level of assistance: SBA and CGA  FUNCTIONAL TESTS:  5 times sit to stand: 21.7 sec hands-free; pt reports difficulty with eccentric component at times must steady with CGA  6 minute walk test: 569 ft  10 meter walk test: 0.54 m/s with RW  exhibits ER and WBOS/unsteadiness and forward posture Berg deferred  PATIENT SURVEYS:  FOTO deferred due to system error  TODAY'S TREATMENT:                                                                                                                              DATE: 12/31/22  Gait belt donned throughout and CGA provided to ensure pt safety unless otherwise noted   NMR: BERG 32/56   Tandem stance 2x30  sec each LE, with UUE support Standing NBOS 2x30 sec, with UUE supprt Comment: added to HEP  TE: Endurance and interval training on nustep - levels 1-3 x 6 min 30 sec. PT adjusts intensity throughout and monitors pt for response. Cuing for speed/SPM, pt maintains around 50 SPM. Lvl 1-3 nustep x 6 min 30 sec  STS 2x5 - fatiguing     PATIENT EDUCATION: Education details: update to HEP, exercise technique Person educated: Patient Education method: Explanation, Demonstration, Verbal cues, and  Handouts Education comprehension: verbalized understanding, returned demonstration, and needs further education  HOME EXERCISE PROGRAM:   12/31/22: Access Code: [email or web address] URL: [email or web address] Date: 12/31/2022 Prepared by: Temple Pacini  Exercises - Standing Tandem Balance with Counter Support  - 2 x daily - 7 x weekly - 1 sets - 2 reps - 30 seconds hold - Romberg Stance  - 2 x daily - 7 x weekly - 1 sets - 2 reps - 30 seconds hold - written instruction to perform at support surface with UE support  Access Code: [email or web address] URL: [email or web address] Date: 12/25/2022 Prepared by: Temple Pacini  Exercises - Sit to Stand with Counter Support  - 1 x daily - 5 x weekly - 4 sets - 5 reps - Standing March with Counter Support  - 1 x daily - 5 x weekly - 2 sets - 10 reps GOALS:  SHORT TERM GOALS: Target date: 02/05/2023      Patient will be independent in home exercise program to improve strength/mobility for better functional independence with ADLs. Baseline: initiated Goal status: INITIAL   LONG TERM GOALS: Target date: 03/19/2023   Patient will increase FOTO score to equal to or greater than     to demonstrate statistically significant improvement in mobility and quality of life.  Baseline: deferred due to system error Goal status: INITIAL  2.  Patient (< 4 years old) will complete five times sit to stand test in < 10 seconds indicating increased LE strength and improved balance. Baseline: 21.7 sec Goal status: INITIAL  3.  Patient will increase Berg Balance score by > 6 points to demonstrate decreased fall risk during functional activities Baseline: 32 Goal status: INITIAL  4.  Patient will increase 10 meter walk test to >1.59m/s as to improve gait speed for better community ambulation and to reduce fall risk. Baseline: 0.54 m/s Goal status: INITIAL  5.  Patient will increase six minute walk test distance to at least 1000 ft for progression to  community ambulator and improved gait ability Baseline:  569 ft Goal status: INITIAL     ASSESSMENT:  CLINICAL IMPRESSION: Pt completes BERG this visit, scoring 32, indicating increased fall risk. PT updated and reviewed HEP to address balance deficits contributing to fall risk. Pt responds well to interventions during session but does fatigue quickly. The pt will benefit from further skilled PT to address these impairments in order to increase functional mobility and decrease fall risk.  OBJECTIVE IMPAIRMENTS: Abnormal gait, decreased activity tolerance, decreased balance, decreased coordination, decreased endurance, decreased mobility, difficulty walking, decreased strength, impaired sensation, impaired tone, improper body mechanics, postural dysfunction, and pain.   ACTIVITY LIMITATIONS: carrying, lifting, standing, squatting, stairs, transfers, continence, and locomotion level  PARTICIPATION LIMITATIONS: cleaning, laundry, shopping, community activity, and yard work  PERSONAL FACTORS: Fitness, Time since onset of injury/illness/exacerbation, and 1-2 comorbidities: PMH consists of spastic ataxia, diabetes,hx of R shoulder bursitis per pt report  are also affecting patient's functional outcome.   REHAB POTENTIAL: Good  CLINICAL DECISION  MAKING: Evolving/moderate complexity  EVALUATION COMPLEXITY: Moderate  PLAN:  PT FREQUENCY: 1-2x/week  PT DURATION: 12 weeks  PLANNED INTERVENTIONS: Therapeutic exercises, Therapeutic activity, Neuromuscular re-education, Balance training, Gait training, Patient/Family education, Self Care, Joint mobilization, Stair training, Vestibular training, Canalith repositioning, Visual/preceptual remediation/compensation, Orthotic/Fit training, DME instructions, Dry Needling, Electrical stimulation, Spinal mobilization, Cryotherapy, Moist heat, Taping, Ultrasound, Manual therapy, and Re-evaluation  PLAN FOR NEXT SESSION: FOTO, strengthening, gait and  balance   Baird Kay, PT 12/31/2022, 3:02 PM

## 2023-01-01 DIAGNOSIS — F331 Major depressive disorder, recurrent, moderate: Secondary | ICD-10-CM | POA: Diagnosis not present

## 2023-01-02 ENCOUNTER — Ambulatory Visit: Payer: 59

## 2023-01-02 DIAGNOSIS — R262 Difficulty in walking, not elsewhere classified: Secondary | ICD-10-CM | POA: Diagnosis not present

## 2023-01-02 DIAGNOSIS — R278 Other lack of coordination: Secondary | ICD-10-CM | POA: Diagnosis not present

## 2023-01-02 DIAGNOSIS — M6281 Muscle weakness (generalized): Secondary | ICD-10-CM | POA: Diagnosis not present

## 2023-01-02 DIAGNOSIS — R2681 Unsteadiness on feet: Secondary | ICD-10-CM

## 2023-01-02 NOTE — Therapy (Signed)
OUTPATIENT PHYSICAL THERAPY NEURO TREATMENT NOTE   Patient Name: Danny Cooper MRN: [id number] DOB:1980-10-11, 42 y.o., male Today's Date: 01/02/2023   PCP: [facility]  REFERRING PROVIDER:   Lonell Face, MD    END OF SESSION:  PT End of Session - 01/02/23 1455     Visit Number 3    Number of Visits 25    Date for PT Re-Evaluation 03/19/23    PT Start Time 1457    PT Stop Time 1528    PT Time Calculation (min) 31 min    Equipment Utilized During Treatment Gait belt    Activity Tolerance Patient tolerated treatment well;Patient limited by fatigue    Behavior During Therapy WFL for tasks assessed/performed              Past Medical History:  Diagnosis Date   Diabetes mellitus without complication (HCC)    Spastic ataxia, hereditary (HCC)    Past Surgical History:  Procedure Laterality Date   CARDIAC SURGERY     age 89   There are no problems to display for this patient.   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 He is ambulating with his cane. He reports trying HEP.  Pt reports some R shoulder pain, he reports this usually occurs when he has to use his cane. He also reports some low back pain.    Pt accompanied by: self  PERTINENT HISTORY:   The pt is a pleasant 42 yo male with hereditary spastic paraplegia ambulating with upright walker referred for PT eval. Pt would like to improve his strength, mobility, balance and gait. He was previously  seen in clinic years ago following diagnosis. Pt reports he returns now due to decline in his mobility that started about "a year to a year and a half ago." Pt uses a cane for short distances and uses his upright walker for longer distances. Pt has had 3-4 falls (maybe more) in the last six months. He reports no serious injury with falls. Falls have occurred with standing up from sitting and from tripping over his feet.  Pt reports decreased endurance,  where he can ambulate <7 minutes before needing to rest. He can stand with assistance for maybe 10-15 minutes. Pt was recently in ED 12/07/22 where he reports he was diagnoses with pneumonia. PMH consists of spastic ataxia, diabetes, seen ED earlier this month with concerns of weakness and lightheadedness where he was found to have pneumonia, hx of R shoulder bursitis per pt report  PAIN:  Are you having pain? No not currently but does have low back pain  PRECAUTIONS: Fall  RED FLAGS: Bowel or bladder incontinence: Yes: reports urinary incontinence with progression of his condition      WEIGHT BEARING RESTRICTIONS: No  FALLS: Has patient fallen in last 6 months? Yes. Number of falls 3-4  LIVING ENVIRONMENT: Lives with: lives with their spouse and with adult step-son, and four y/o   Lives in: House/apartment Stairs: Yes: External: 4-5 steps; on right going up and on left going up Has following equipment at home: Single point cane, Walker - 2 wheeled, and [employer] - 4 wheeled  PLOF: Independent  PATIENT GOALS: improve mobility, strength, balance  OBJECTIVE: taken at eval unless specified otherwise  DIAGNOSTIC FINDINGS:  Via chart  CT HEAD 12/07/22 " IMPRESSION: 1. No acute intracranial abnormalities. 2. Mild cerebral atrophy advanced for age again noted.     Electronically Signed   By: Trudie Reed M.D.   On: 12/07/2022 10:06"    COGNITION: Overall cognitive status: Within functional limits for tasks  assessed   SENSATION: Pt reports he can have tingling in his limbs UE/LE, he reports he can have numbness in his feet and hands, rarely in arms/legs   MUSCLE TONE:has spasticity    POSTURE: rounded shoulders   COORDINATION: UE impaired with finger chin<>target UE impaired alternating hand movement  LE slight pain limitation on R, likely limited due to mobility as well  LOWER EXTREMITY MMT:    BLE grossly 4+/5 - greatest deficits hip flexors and adductors   *pain felt in R hip flexor with hip flexion MMT   TRANSFERS: Assistive device utilized: Environmental consultant - 2 wheeled  Sit to stand: Modified independence Stand to sit: Modified independence Chair to chair: Modified independence     GAIT: Gait pattern:  impaired gait speed, ambulates with WBOS, forward posture Distance walked: clinic distances: , Assistive device utilized: Environmental consultant - 2 wheeled upwalker Level of assistance: SBA and CGA  FUNCTIONAL TESTS:  5 times sit to stand: 21.7 sec hands-free; pt reports difficulty with eccentric component at times must steady with CGA  6 minute walk test: 569 ft  10 meter walk test: 0.54 m/s with RW  exhibits ER and WBOS/unsteadiness and forward posture Berg deferred  PATIENT SURVEYS:  FOTO deferred due to system error  TODAY'S TREATMENT:                                                                                                                              DATE: 01/02/23  Gait belt  donned throughout and CGA provided to ensure pt safety unless otherwise noted    TE: Amb with RW with 2# AW donned each LE 1x148 ft. Performed to promote endurance/LE strength. Pt exhibits knee hyperext. during mid-stance -- repeats 2x148 ft with 5# AW donned - fatigues in bilat shoulders  STS 4x5 Requires rest breaks throughout Seated march with 2# AW, 2x10 each LE. Initial attempt with 5# on RLE but discontinued after 6 reps due to significant difficulty.   Standing alt step tap onto 6"  step with 2# AW donned each LE 2x10, then onto 12" step 2x10      PATIENT EDUCATION: Education details: Pt educated throughout session about proper posture and technique with exercises. Improved exercise technique, movement at target joints, use of target muscles after min to mod verbal, visual, tactile cues.  Person educated: Patient Education method: Explanation, Demonstration, and Verbal cues Education comprehension: verbalized understanding, returned demonstration, and needs further education  HOME EXERCISE PROGRAM:   12/31/22: Access Code: [email or web address] URL: [email or web address] Date: 12/31/2022 Prepared by: Temple Pacini  Exercises - Standing Tandem Balance with Counter Support  - 2 x daily - 7 x weekly - 1 sets - 2 reps - 30 seconds hold - Romberg Stance  - 2 x daily - 7 x weekly - 1 sets - 2 reps - 30 seconds hold - written instruction to perform at support surface with UE support  Access Code: [email or web address] URL: [email or web address] Date: 12/25/2022 Prepared by: Temple Pacini  Exercises - Sit to Stand with Counter Support  - 1 x daily - 5 x weekly - 4 sets - 5 reps - Standing March with Counter Support  - 1 x daily - 5 x weekly - 2 sets - 10 reps GOALS:  SHORT TERM GOALS: Target date: 02/05/2023      Patient will be independent in home exercise program to improve strength/mobility for better functional independence with ADLs. Baseline: initiated Goal status: INITIAL   LONG TERM GOALS: Target date: 03/19/2023   Patient will increase FOTO score to equal to or greater than     to demonstrate statistically significant improvement in mobility and quality of life.  Baseline: deferred due to system error Goal status: INITIAL  2.  Patient (< 11 years old) will complete five times sit to stand test in < 10 seconds indicating increased LE strength and improved balance. Baseline: 21.7 sec Goal status: INITIAL  3.  Patient will increase Berg Balance  score by > 6 points to demonstrate decreased fall risk during functional activities Baseline: 32 Goal status: INITIAL  4.  Patient will increase 10 meter walk test to >1.50m/s as to improve gait speed for better community ambulation and to reduce fall risk. Baseline: 0.54 m/s Goal status: INITIAL  5.  Patient will increase six minute walk test distance to at least 1000 ft for progression to community ambulator and improved gait ability Baseline:  569 ft Goal status: INITIAL     ASSESSMENT:  CLINICAL IMPRESSION: Pt exhibits L knee hyper ext. during gait. He would possibly benefit from orthotic to address this, but would benefit from further assessment. Session overall somewhat limited due to pt late arrival. However, pt motivated to participate during visit.The pt will benefit from further skilled PT to address these impairments in order to increase functional mobility and decrease fall risk.  OBJECTIVE IMPAIRMENTS: Abnormal gait, decreased activity tolerance, decreased balance, decreased coordination, decreased endurance, decreased mobility, difficulty walking, decreased strength, impaired sensation, impaired tone, improper body mechanics, postural  dysfunction, and pain.   ACTIVITY LIMITATIONS: carrying, lifting, standing, squatting, stairs, transfers, continence, and locomotion level  PARTICIPATION LIMITATIONS: cleaning, laundry, shopping, community activity, and yard work  PERSONAL FACTORS: Fitness, Time since onset of injury/illness/exacerbation, and 1-2 comorbidities: PMH consists of spastic ataxia, diabetes,hx of R shoulder bursitis per pt report  are also affecting patient's functional outcome.   REHAB POTENTIAL: Good  CLINICAL DECISION MAKING: Evolving/moderate complexity  EVALUATION COMPLEXITY: Moderate  PLAN:  PT FREQUENCY: 1-2x/week  PT DURATION: 12 weeks  PLANNED INTERVENTIONS: Therapeutic exercises, Therapeutic activity, Neuromuscular re-education, Balance training,  Gait training, Patient/Family education, Self Care, Joint mobilization, Stair training, Vestibular training, Canalith repositioning, Visual/preceptual remediation/compensation, Orthotic/Fit training, DME instructions, Dry Needling, Electrical stimulation, Spinal mobilization, Cryotherapy, Moist heat, Taping, Ultrasound, Manual therapy, and Re-evaluation  PLAN FOR NEXT SESSION: FOTO, strengthening, gait and balance   Baird Kay, PT 01/02/2023, 5:11 PM

## 2023-01-06 ENCOUNTER — Ambulatory Visit: Payer: 59

## 2023-01-06 DIAGNOSIS — M6281 Muscle weakness (generalized): Secondary | ICD-10-CM

## 2023-01-06 DIAGNOSIS — R262 Difficulty in walking, not elsewhere classified: Secondary | ICD-10-CM

## 2023-01-06 DIAGNOSIS — R278 Other lack of coordination: Secondary | ICD-10-CM | POA: Diagnosis not present

## 2023-01-06 DIAGNOSIS — R2681 Unsteadiness on feet: Secondary | ICD-10-CM | POA: Diagnosis not present

## 2023-01-06 NOTE — Therapy (Signed)
OUTPATIENT PHYSICAL THERAPY NEURO TREATMENT NOTE   Patient Name: Danny Cooper MRN: [id number] DOB:[birth year], 42 y.o., male Today's Date: 01/06/2023   PCP: [facility]  REFERRING PROVIDER:   Lonell Face, MD    END OF SESSION:  PT End of Session - 01/06/23 1320     Visit Number 4    Number of Visits 25    Date for PT Re-Evaluation 03/19/23    PT Start Time 1315    PT Stop Time 1355    PT Time Calculation (min) 40 min    Equipment Utilized During Treatment Gait belt    Activity Tolerance Patient tolerated treatment well;Patient limited by fatigue    Behavior During Therapy WFL for tasks assessed/performed              Past Medical History:  Diagnosis Date   Diabetes mellitus without complication (HCC)    Spastic ataxia, hereditary (HCC)    Past Surgical History:  Procedure Laterality Date   CARDIAC SURGERY     age 77   There are no problems to display for this patient.   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 He is ambulating with his cane.  Mom has ordered   Pt accompanied by: self  PERTINENT HISTORY:   The pt is a pleasant 42 yo male with hereditary spastic paraplegia ambulating with upright walker referred for PT eval. Pt would like to improve his strength, mobility, balance and gait. He was previously seen in clinic years ago following diagnosis. Pt reports he returns now due to decline in his mobility that started about "a year to a year  and a half ago." Pt uses a cane for short distances and uses his upright walker for longer distances. Pt has had 3-4 falls (maybe more) in the last six months. He reports no serious injury with falls. Falls have occurred with standing up from sitting and from tripping over his feet.  Pt reports decreased endurance, where he can ambulate <7 minutes before needing to rest. He can stand with assistance for maybe 10-15 minutes. Pt was recently in ED 12/07/22 where he  reports he was diagnoses with pneumonia. PMH consists of spastic ataxia, diabetes, seen ED earlier this month with concerns of weakness and lightheadedness where he was found to have pneumonia, hx of R shoulder bursitis per pt report  PAIN:  Are you having pain? No PRECAUTIONS: Fall  RED FLAGS: Bowel or bladder incontinence: Yes: reports urinary incontinence with progression of his condition      WEIGHT BEARING RESTRICTIONS: No  FALLS: Has patient fallen in last 6 months? Yes. Number of falls 3-4   PATIENT GOALS: improve mobility, strength, balance  OBJECTIVE: taken at eval unless specified otherwise   PATIENT SURVEYS:  FOTO 42  TODAY'S TREATMENT:                                                                                                                              DATE: 01/06/23  *Gait belt donned throughout and CGA provided to ensure pt safety unless otherwise noted  -AMB 1101ft overground c SPC RUE  -AMB to wellzone leg press Level 5 x12 (2 sets with rest between)   *at which point author applies 2lb AW and gives RW for BUE support  -standing marching 1x30 alternating  -seated LAQ 1x30 alternating  - seated sumo deadlift 15lb KB 1x10     PATIENT EDUCATION: Education details: Pt educated throughout session about proper posture and technique with exercises. Improved exercise technique, movement at target joints, use of target muscles after min to mod verbal, visual, tactile cues.  Person educated:  Patient Education method: Explanation, Demonstration, and Verbal cues Education comprehension: verbalized understanding, returned demonstration, and needs further education  HOME EXERCISE PROGRAM:   12/31/22: Access Code: [email or web address] URL: [email or web address] Date: 12/31/2022 Prepared by: Temple Pacini  Exercises - Standing Tandem Balance with Counter Support  - 2 x daily - 7 x weekly - 1 sets - 2 reps - 30 seconds hold - Romberg Stance  - 2 x daily - 7 x weekly - 1 sets - 2 reps - 30 seconds hold - written instruction to perform at support surface with UE support  Access Code: [email or web address] URL: [email or web address] Date: 12/25/2022 Prepared by: Temple Pacini  Exercises - Sit to Stand with Counter Support  - 1 x daily - 5 x weekly - 4 sets - 5 reps - Standing March with Counter Support  - 1 x daily - 5 x weekly - 2 sets - 10 reps GOALS:  SHORT TERM GOALS: Target date: 02/05/2023   Patient will be independent in home exercise program to improve strength/mobility for better functional independence with ADLs. Baseline: initiated Goal status: INITIAL   LONG TERM GOALS: Target date: 03/19/2023   Patient will increase FOTO score to equal to or greater than  to demonstrate statistically significant improvement in mobility and quality of life.  Baseline: deferred due to system error; 01/06/23: 42 Goal status: INITIAL  2.  Patient (< 101 years old) will complete five times sit to stand test in <  10 seconds indicating increased LE strength and improved balance. Baseline: 21.7 sec Goal status: INITIAL  3.  Patient will increase Berg Balance score by > 6 points to demonstrate decreased fall risk during functional activities Baseline: 32 Goal status: INITIAL  4.  Patient will increase 10 meter walk test to >1.34m/s as to improve gait speed for better community ambulation and to reduce fall risk. Baseline: 0.54 m/s Goal status: INITIAL  5.  Patient will increase six  minute walk test distance to at least 1000 ft for progression to community ambulator and improved gait ability Baseline:  569 ft Goal status: INITIAL     ASSESSMENT:  CLINICAL IMPRESSION:  Continued with strength and balance training. Used leg press and seated deadlift to work deeper ROM invovled in STS transfers which remain quite weak. The pt will benefit from further skilled PT to address these impairments in order to increase functional mobility and decrease fall risk.  OBJECTIVE IMPAIRMENTS: Abnormal gait, decreased activity tolerance, decreased balance, decreased coordination, decreased endurance, decreased mobility, difficulty walking, decreased strength, impaired sensation, impaired tone, improper body mechanics, postural dysfunction, and pain.   ACTIVITY LIMITATIONS: carrying, lifting, standing, squatting, stairs, transfers, continence, and locomotion level  PARTICIPATION LIMITATIONS: cleaning, laundry, shopping, community activity, and yard work  PERSONAL FACTORS: Fitness, Time since onset of injury/illness/exacerbation, and 1-2 comorbidities: PMH consists of spastic ataxia, diabetes,hx of R shoulder bursitis per pt report  are also affecting patient's functional outcome.   REHAB POTENTIAL: Good  CLINICAL DECISION MAKING: Evolving/moderate complexity  EVALUATION COMPLEXITY: Moderate  PLAN:  PT FREQUENCY: 1-2x/week  PT DURATION: 12 weeks  PLANNED INTERVENTIONS: Therapeutic exercises, Therapeutic activity, Neuromuscular re-education, Balance training, Gait training, Patient/Family education, Self Care, Joint mobilization, Stair training, Vestibular training, Canalith repositioning, Visual/preceptual remediation/compensation, Orthotic/Fit training, DME instructions, Dry Needling, Electrical stimulation, Spinal mobilization, Cryotherapy, Moist heat, Taping, Ultrasound, Manual therapy, and Re-evaluation  PLAN FOR NEXT SESSION: FOTO, strengthening, gait and  balance   Mujtaba Bollig C, PT 01/06/2023, 1:21 PM  2:00 PM, 01/06/23 Rosamaria Lints, PT, DPT Physical Therapist - [facility]  Outpatient Physical Therapy- Main Campus [phone]

## 2023-01-07 DIAGNOSIS — F331 Major depressive disorder, recurrent, moderate: Secondary | ICD-10-CM | POA: Diagnosis not present

## 2023-01-08 ENCOUNTER — Ambulatory Visit: Payer: 59

## 2023-01-08 DIAGNOSIS — M6281 Muscle weakness (generalized): Secondary | ICD-10-CM

## 2023-01-08 DIAGNOSIS — R278 Other lack of coordination: Secondary | ICD-10-CM | POA: Diagnosis not present

## 2023-01-08 DIAGNOSIS — R2681 Unsteadiness on feet: Secondary | ICD-10-CM | POA: Diagnosis not present

## 2023-01-08 DIAGNOSIS — R262 Difficulty in walking, not elsewhere classified: Secondary | ICD-10-CM

## 2023-01-08 NOTE — Therapy (Signed)
OUTPATIENT PHYSICAL THERAPY TREATMENT   Patient Name: Danny Cooper MRN: [id number] DOB:1980/07/17, 42 y.o., male Today's Date: 01/08/2023   PCP: [facility]  REFERRING PROVIDER:   Lonell Face, MD    END OF SESSION:  PT End of Session - 01/08/23 1543     Visit Number 5    Number of Visits 25    Date for PT Re-Evaluation 03/19/23    Authorization Type [employer]    Progress Note Due on Visit 10    PT Start Time 1540    PT Stop Time 1610    PT Time Calculation (min) 30 min    Equipment Utilized During Treatment Gait belt    Activity Tolerance Patient tolerated treatment well;Patient limited by fatigue    Behavior During Therapy WFL for tasks assessed/performed              Past Medical History:  Diagnosis Date   Diabetes mellitus without complication (HCC)    Spastic ataxia, hereditary (HCC)    Past Surgical History:  Procedure Laterality Date   CARDIAC SURGERY     age 59   There are no problems to display for this patient.   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   Pt accompanied by: self  PERTINENT HISTORY:   The pt is a pleasant 42 yo male with hereditary spastic paraplegia ambulating with upright walker referred for PT eval. Pt would like to improve his strength, mobility, balance and gait. He was previously seen in clinic years ago following diagnosis. Pt reports he returns now due to  decline in his mobility that started about "a year to a year and a half ago." Pt uses a cane for short distances and uses his upright walker for longer distances. Pt has had 3-4 falls (maybe more) in the last six months. He reports no serious injury with falls. Falls have occurred with standing up from sitting and from tripping over his feet.  Pt reports decreased endurance, where he can ambulate <7 minutes before needing to rest. He can stand with  assistance for maybe 10-15 minutes. Pt was recently in ED 12/07/22 where he reports he was diagnoses with pneumonia. PMH consists of spastic ataxia, diabetes, seen ED earlier this month with concerns of weakness and lightheadedness where he was found to have pneumonia, hx of R shoulder bursitis per pt report  PAIN:  Are you having pain? No PRECAUTIONS: Fall  RED FLAGS: Bowel or bladder incontinence: Yes: reports urinary incontinence with progression of his condition    WEIGHT BEARING RESTRICTIONS: No  FALLS: Has patient fallen in last 6 months? Yes. Number of falls 3-4  PATIENT GOALS: improve mobility, strength, balance  OBJECTIVE: taken at eval unless specified otherwise    TODAY'S TREATMENT:                                                                                                                              DATE: 01/08/23  *Gait belt donned throughout and CGA provided to ensure pt safety unless otherwise noted  -AMB overground c SPC RUE to waiting area to rehab gym  -STS from low plinth 1x10 hands free -AMB 156ft loop around clinic with tight spaces and navigating turns, 3lb AW  -STS from low plinth 1x10 hands free -AMB 163ft loop around clinic with tight spaces and navigating turns, 3lb AW   -STS from elevated plinth, feet on 2 airex pads (side by side for baseline width) standing balance hands-free until LOB into sitting 10x total *education on use of trunk flexion for sagittal plane righting strategy.      PATIENT  EDUCATION: Education details: Pt educated throughout session about proper posture and technique with exercises. Improved exercise technique, movement at target joints, use of target muscles after min to mod verbal, visual, tactile cues.  Person educated: Patient Education method: Explanation, Demonstration, and Verbal cues Education comprehension: verbalized understanding, returned demonstration, and needs further education  HOME EXERCISE PROGRAM: 12/31/22: Access Code: [email or web address] URL: [email or web address] Date: 12/31/2022 Prepared by: Temple Pacini  Exercises - Standing Tandem Balance with Counter Support  - 2 x daily - 7 x weekly - 1 sets - 2 reps - 30 seconds hold - Romberg Stance  - 2 x daily - 7 x weekly - 1 sets - 2 reps - 30 seconds hold - written instruction to perform at support surface with UE support  Access Code: [email or web address] URL: [email or web address] Date: 12/25/2022 Prepared by: Temple Pacini  Exercises - Sit to Stand with Counter Support  - 1 x daily - 5 x weekly - 4 sets - 5 reps - Standing March with Counter Support  - 1 x daily - 5 x weekly - 2 sets - 10 reps GOALS:  SHORT TERM GOALS: Target date: 02/05/2023   Patient will be independent in home exercise program to improve strength/mobility for better functional independence with ADLs. Baseline: initiated Goal status: INITIAL   LONG TERM GOALS: Target date: 03/19/2023   Patient will increase FOTO score to equal to or greater than  to demonstrate statistically significant improvement in mobility and quality of life.  Baseline: deferred due to system error; 01/06/23: 42 Goal status: INITIAL  2.  Patient (< 25 years old) will complete five times sit to stand test in < 10 seconds indicating increased LE strength and improved balance. Baseline: 21.7 sec Goal status: INITIAL  3.  Patient will increase Berg Balance score by > 6 points to demonstrate decreased fall risk during functional  activities Baseline: 32 Goal status: INITIAL  4.  Patient will increase 10 meter walk test to >1.63m/s as to improve gait speed for better community ambulation and to reduce fall risk. Baseline: 0.54 m/s Goal status: INITIAL  5.  Patient will increase six minute walk test distance to at least 1000 ft for progression to community ambulator and improved gait ability Baseline:  569 ft Goal status: INITIAL     ASSESSMENT:  CLINICAL IMPRESSION: Pt arrives a few minutes late, but ready for session. Continued with interval training with ankle resistance and more tight, complicated routes today to increase trunk/core involvement. Spent ~6 minutes working on foam stance balance unsupported, pt showing improvement from start to finish. The pt will benefit from further skilled PT to address these impairments in order to increase functional mobility and decrease fall risk.  OBJECTIVE IMPAIRMENTS: Abnormal gait, decreased activity tolerance, decreased balance, decreased coordination, decreased endurance, decreased mobility, difficulty walking, decreased strength, impaired sensation, impaired tone, improper body mechanics, postural dysfunction, and pain.   ACTIVITY LIMITATIONS: carrying, lifting, standing, squatting, stairs, transfers, continence, and locomotion level  PARTICIPATION LIMITATIONS: cleaning, laundry, shopping, community activity, and yard work  PERSONAL FACTORS: Fitness, Time since onset of injury/illness/exacerbation, and 1-2 comorbidities: PMH consists of spastic ataxia, diabetes,hx of R shoulder bursitis per pt report  are also affecting patient's functional outcome.   REHAB POTENTIAL: Good  CLINICAL DECISION MAKING: Evolving/moderate complexity  EVALUATION COMPLEXITY: Moderate  PLAN:  PT FREQUENCY: 1-2x/week  PT DURATION: 12 weeks  PLANNED INTERVENTIONS: Therapeutic exercises, Therapeutic activity, Neuromuscular re-education, Balance training, Gait training, Patient/Family  education, Self Care, Joint mobilization, Stair training, Vestibular training, Canalith repositioning, Visual/preceptual remediation/compensation, Orthotic/Fit training, DME instructions, Dry Needling, Electrical stimulation, Spinal mobilization, Cryotherapy, Moist heat, Taping, Ultrasound, Manual therapy, and Re-evaluation  PLAN FOR NEXT SESSION: FOTO, strengthening, gait and balance   Sahirah Rudell C, PT 01/08/2023, 3:49 PM  3:49 PM, 01/08/23 Rosamaria Lints, PT, DPT Physical Therapist -  [facility]  Outpatient Physical Therapy- [facility] [phone]

## 2023-01-13 ENCOUNTER — Ambulatory Visit: Payer: 59

## 2023-01-13 DIAGNOSIS — M6281 Muscle weakness (generalized): Secondary | ICD-10-CM

## 2023-01-13 DIAGNOSIS — R262 Difficulty in walking, not elsewhere classified: Secondary | ICD-10-CM | POA: Diagnosis not present

## 2023-01-13 DIAGNOSIS — R278 Other lack of coordination: Secondary | ICD-10-CM

## 2023-01-13 DIAGNOSIS — R2681 Unsteadiness on feet: Secondary | ICD-10-CM | POA: Diagnosis not present

## 2023-01-13 NOTE — Therapy (Signed)
OUTPATIENT PHYSICAL THERAPY TREATMENT   Patient Name: Danny Cooper MRN: [id number] DOB:02-Dec-1980, 42 y.o., male Today's Date: 01/13/2023   PCP: [facility]  REFERRING PROVIDER:   Lonell Face, MD    END OF SESSION:  PT End of Session - 01/13/23 1334     Visit Number 6    Number of Visits 25    Date for PT Re-Evaluation 03/19/23    Authorization Type [employer]    Progress Note Due on Visit 10    PT Start Time 1515    PT Stop Time 1555    PT Time Calculation (min) 40 min              Past Medical History:  Diagnosis Date   Diabetes mellitus without complication (HCC)    Spastic ataxia, hereditary (HCC)    Past Surgical History:  Procedure Laterality Date   CARDIAC SURGERY     age 75   There are no problems to display for this patient.   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 Still learning how to set it up and use it.   Pt accompanied by: self  PERTINENT HISTORY:   The pt is a pleasant 42 yo male with hereditary spastic paraplegia ambulating with upright walker referred for PT eval. Pt would like to improve his strength, mobility, balance and gait. He was previously seen in clinic years ago following diagnosis. Pt reports he returns now due to decline in his mobility that started about "a year to a year and a half ago." Pt uses a cane for short distances and uses his upright walker for longer distances. Pt has had 3-4 falls (maybe more) in the last six  months. He reports no serious injury with falls. Falls have occurred with standing up from sitting and from tripping over his feet.  Pt reports decreased endurance, where he can ambulate <7 minutes before needing to rest. He can stand with assistance for maybe 10-15 minutes. Pt was recently in ED 12/07/22 where he reports he was diagnoses with pneumonia. PMH consists of spastic ataxia, diabetes, seen ED earlier this month with  concerns of weakness and lightheadedness where he was found to have pneumonia, hx of R shoulder bursitis per pt report  PAIN:  Are you having pain? No PRECAUTIONS: Fall  RED FLAGS: Bowel or bladder incontinence: Yes: reports urinary incontinence with progression of his condition    WEIGHT BEARING RESTRICTIONS: No  FALLS: Has patient fallen in last 6 months? Yes. Number of falls 3-4  PATIENT GOALS: improve mobility, strength, balance  OBJECTIVE: taken at eval unless specified otherwise    TODAY'S TREATMENT:                                                                                                                              DATE: 01/13/23  -STS from chair x10 -STS from plinth x10 -STS from plinth and feet on airex pads 1x10 (2 false starts, minGuard assist)  -AMB overground x126ft walker as is -AMB overground x165ft with wlaker seat locked (wheels unlevel, but improve with increased loading of elbow rests)     PATIENT EDUCATION: Education details: Pt educated on function, setup, use of new walker. Brakes adjustment, seat lock/unlock.   HOME EXERCISE PROGRAM: 12/31/22: Access Code: [email or web address] URL: [email or web address] Date: 12/31/2022 Prepared by: Temple Pacini  Exercises - Standing Tandem Balance with Counter Support  - 2 x daily - 7 x weekly - 1 sets - 2 reps - 30 seconds hold - Romberg Stance  - 2 x daily - 7 x weekly - 1 sets - 2 reps - 30 seconds hold - written instruction to perform at support surface with UE support  Access Code:  [email or web address] URL: [email or web address] Date: 12/25/2022 Prepared by: Temple Pacini  Exercises - Sit to Stand with Counter Support  - 1 x daily - 5 x weekly - 4 sets - 5 reps - Standing March with Counter Support  - 1 x daily - 5 x weekly - 2 sets - 10 reps GOALS:  SHORT TERM GOALS: Target date: 02/05/2023   Patient will be independent in home exercise program to improve strength/mobility for better functional independence with ADLs. Baseline: initiated Goal status: INITIAL   LONG TERM GOALS: Target date: 03/19/2023   Patient will increase FOTO score to equal to or greater than  to demonstrate statistically significant improvement in mobility and quality of life.  Baseline: deferred due to system error; 01/06/23: 42 Goal status: INITIAL  2.  Patient (< 66 years old) will complete five times sit to stand test in < 10 seconds indicating increased LE strength and improved balance. Baseline: 21.7 sec Goal status: INITIAL  3.  Patient will increase Berg Balance score by > 6 points to demonstrate decreased fall risk during functional activities Baseline: 32 Goal status: INITIAL  4.  Patient will increase 10 meter walk test to >1.34m/s as to improve gait speed for better community ambulation and to reduce fall risk. Baseline: 0.54 m/s Goal status: INITIAL  5.  Patient will increase six minute walk test distance to at least  1000 ft for progression to community ambulator and improved gait ability Baseline:  569 ft Goal status: INITIAL     ASSESSMENT:  CLINICAL IMPRESSION: Conitnued to work on leg strength, motor control, and standing balance. Pt bringing new upwalker and is educated on operation, setup, adjustment, and use. When seat is in locked position, wheels are uneven on floor, unable to determine where the misalignment is coming from. Pt cautioned on use. Pt will benefit from further skilled PT to address these impairments in order to increase functional mobility and  decrease fall risk.  OBJECTIVE IMPAIRMENTS: Abnormal gait, decreased activity tolerance, decreased balance, decreased coordination, decreased endurance, decreased mobility, difficulty walking, decreased strength, impaired sensation, impaired tone, improper body mechanics, postural dysfunction, and pain.   ACTIVITY LIMITATIONS: carrying, lifting, standing, squatting, stairs, transfers, continence, and locomotion level  PARTICIPATION LIMITATIONS: cleaning, laundry, shopping, community activity, and yard work  PERSONAL FACTORS: Fitness, Time since onset of injury/illness/exacerbation, and 1-2 comorbidities: PMH consists of spastic ataxia, diabetes,hx of R shoulder bursitis per pt report  are also affecting patient's functional outcome.   REHAB POTENTIAL: Good  CLINICAL DECISION MAKING: Evolving/moderate complexity  EVALUATION COMPLEXITY: Moderate  PLAN:  PT FREQUENCY: 1-2x/week  PT DURATION: 12 weeks  PLANNED INTERVENTIONS: Therapeutic exercises, Therapeutic activity, Neuromuscular re-education, Balance training, Gait training, Patient/Family education, Self Care, Joint mobilization, Stair training, Vestibular training, Canalith repositioning, Visual/preceptual remediation/compensation, Orthotic/Fit training, DME instructions, Dry Needling, Electrical stimulation, Spinal mobilization, Cryotherapy, Moist heat, Taping, Ultrasound, Manual therapy, and Re-evaluation  PLAN FOR NEXT SESSION: FOTO, strengthening, gait and balance   Diego Delancey C, PT 01/13/2023, 4:48 PM  4:48 PM, 01/13/23 Rosamaria Lints, PT, DPT Physical Therapist - [place] [facility]  Outpatient Physical Therapy- Main [facility] [phone]

## 2023-01-15 ENCOUNTER — Ambulatory Visit: Payer: 59

## 2023-01-15 DIAGNOSIS — R2681 Unsteadiness on feet: Secondary | ICD-10-CM

## 2023-01-15 DIAGNOSIS — R262 Difficulty in walking, not elsewhere classified: Secondary | ICD-10-CM

## 2023-01-15 DIAGNOSIS — R278 Other lack of coordination: Secondary | ICD-10-CM | POA: Diagnosis not present

## 2023-01-15 DIAGNOSIS — M6281 Muscle weakness (generalized): Secondary | ICD-10-CM | POA: Diagnosis not present

## 2023-01-15 NOTE — Therapy (Signed)
OUTPATIENT PHYSICAL THERAPY TREATMENT   Patient Name: Danny Cooper MRN: [id number] DOB:August 20, 1980, 42 y.o., male Today's Date: 01/15/2023   PCP: [facility]  REFERRING PROVIDER:   Lonell Face, MD    END OF SESSION:  PT End of Session - 01/15/23 1414     Visit Number 7    Number of Visits 25    Date for PT Re-Evaluation 03/19/23    Authorization Type [employer]    Progress Note Due on Visit 10    PT Start Time 1405    PT Stop Time 1445    PT Time Calculation (min) 40 min    Activity Tolerance Patient tolerated treatment well;Patient limited by fatigue    Behavior During Therapy WFL for tasks assessed/performed              Past Medical History:  Diagnosis Date   Diabetes mellitus without complication (HCC)    Spastic ataxia, hereditary (HCC)    Past Surgical History:  Procedure Laterality Date   CARDIAC SURGERY     age 60   There are no problems to display for this patient.   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 Still learning how to set it up and use it.   Pt accompanied by: self  PERTINENT HISTORY:   The pt is a pleasant 42 yo male with hereditary spastic paraplegia ambulating with upright walker referred for PT eval. Pt would like to improve his strength, mobility, balance and gait. He was previously seen in clinic years ago following diagnosis. Pt reports he returns now due to decline in his mobility that started about "a year to a year and a  half ago." Pt uses a cane for short distances and uses his upright walker for longer distances. Pt has had 3-4 falls (maybe more) in the last six months. He reports no serious injury with falls. Falls have occurred with standing up from sitting and from tripping over his feet.  Pt reports decreased endurance, where he can ambulate <7 minutes before needing to rest. He can stand with assistance for maybe 10-15 minutes. Pt was recently in  ED 12/07/22 where he reports he was diagnoses with pneumonia. PMH consists of spastic ataxia, diabetes, seen ED earlier this month with concerns of weakness and lightheadedness where he was found to have pneumonia, hx of R shoulder bursitis per pt report  PAIN:  Are you having pain? No PRECAUTIONS: Fall  RED FLAGS: Bowel or bladder incontinence: Yes: reports urinary incontinence with progression of his condition    WEIGHT BEARING RESTRICTIONS: No  FALLS: Has patient fallen in last 6 months? Yes. Number of falls 3-4  PATIENT GOALS: improve mobility, strength, balance  OBJECTIVE: taken at eval unless specified otherwise    TODAY'S TREATMENT:                                                                                                                              DATE: 01/15/23  -overground AMB c high 4WW (463ft), minGuard -in // bars with 2 hand support: 4" step, 2 inch foam, qc cane step over: 4x forward/backward; 3x laterally  - airex stance with anterior load transfer 1-11lbs, single and double hand  -cable resisted AMB with 7.5lb (alittle light maybe) fwd back x2, then side stepping twice each at 7.5 lb (appropriate resistance) all in// bars   PATIENT EDUCATION: Education details: Pt educated on function, setup, use of new walker. Brakes adjustment, seat lock/unlock.   HOME EXERCISE PROGRAM: 12/31/22: Access Code: [email or web address] URL: [email or web address] Date: 12/31/2022 Prepared by: Temple Pacini  Exercises - Standing Tandem Balance with  Counter Support  - 2 x daily - 7 x weekly - 1 sets - 2 reps - 30 seconds hold - Romberg Stance  - 2 x daily - 7 x weekly - 1 sets - 2 reps - 30 seconds hold - written instruction to perform at support surface with UE support  Access Code: [email or web address] URL: [email or web address] Date: 12/25/2022 Prepared by: Temple Pacini  Exercises - Sit to Stand with Counter Support  - 1 x daily - 5 x weekly - 4 sets - 5 reps - Standing March with Counter Support  - 1 x daily - 5 x weekly - 2 sets - 10 reps GOALS:  SHORT TERM GOALS: Target date: 02/05/2023   Patient will be independent in home exercise program to improve strength/mobility for better functional independence with ADLs. Baseline: initiated Goal status: INITIAL   LONG TERM GOALS: Target date: 03/19/2023   Patient will increase FOTO score to equal to or greater than  to demonstrate statistically significant improvement in mobility and quality of life.  Baseline: deferred due to system error; 01/06/23: 42 Goal status: INITIAL  2.  Patient (< 51 years old) will complete five times sit to stand test in < 10 seconds indicating increased LE strength and improved balance. Baseline: 21.7 sec Goal status: INITIAL  3.  Patient will increase Berg Balance score by > 6 points to demonstrate decreased fall risk during functional activities Baseline: 32 Goal status: INITIAL  4.  Patient will increase 10  meter walk test to >1.48m/s as to improve gait speed for better community ambulation and to reduce fall risk. Baseline: 0.54 m/s Goal status: INITIAL  5.  Patient will increase six minute walk test distance to at least 1000 ft for progression to community ambulator and improved gait ability Baseline:  569 ft Goal status: INITIAL   ASSESSMENT:  CLINICAL IMPRESSION: Pt returns with new walker again, report she is slowly adjusting to it, has made some adjustments. Progressed intensity of gait training, pt able to verbalize stamina and  limitations of endurance to maintain safety. Pt does get fatigued with sustained activity ~4-5 minutes. No LOB in session. Pt will benefit from further skilled PT to address these impairments in order to increase functional mobility and decrease fall risk.  OBJECTIVE IMPAIRMENTS: Abnormal gait, decreased activity tolerance, decreased balance, decreased coordination, decreased endurance, decreased mobility, difficulty walking, decreased strength, impaired sensation, impaired tone, improper body mechanics, postural dysfunction, and pain.   ACTIVITY LIMITATIONS: carrying, lifting, standing, squatting, stairs, transfers, continence, and locomotion level  PARTICIPATION LIMITATIONS: cleaning, laundry, shopping, community activity, and yard work  PERSONAL FACTORS: Fitness, Time since onset of injury/illness/exacerbation, and 1-2 comorbidities: PMH consists of spastic ataxia, diabetes,hx of R shoulder bursitis per pt report  are also affecting patient's functional outcome.   REHAB POTENTIAL: Good  CLINICAL DECISION MAKING: Evolving/moderate complexity  EVALUATION COMPLEXITY: Moderate  PLAN:  PT FREQUENCY: 1-2x/week  PT DURATION: 12 weeks  PLANNED INTERVENTIONS: Therapeutic exercises, Therapeutic activity, Neuromuscular re-education, Balance training, Gait training, Patient/Family education, Self Care, Joint mobilization, Stair training, Vestibular training, Canalith repositioning, Visual/preceptual remediation/compensation, Orthotic/Fit training, DME instructions, Dry Needling, Electrical stimulation, Spinal mobilization, Cryotherapy, Moist heat, Taping, Ultrasound, Manual therapy, and Re-evaluation  PLAN FOR NEXT SESSION: strengthening, gait, and balance   Thatiana Renbarger C, PT 01/15/2023, 2:22 PM  2:22 PM, 01/15/23 Rosamaria Lints, PT, DPT Physical Therapist - [place] [facility]  Outpatient Physical Therapy- Main Campus [phone]

## 2023-01-20 ENCOUNTER — Ambulatory Visit: Payer: 59

## 2023-01-20 DIAGNOSIS — R262 Difficulty in walking, not elsewhere classified: Secondary | ICD-10-CM

## 2023-01-20 DIAGNOSIS — M6281 Muscle weakness (generalized): Secondary | ICD-10-CM

## 2023-01-20 DIAGNOSIS — R2681 Unsteadiness on feet: Secondary | ICD-10-CM

## 2023-01-20 DIAGNOSIS — R278 Other lack of coordination: Secondary | ICD-10-CM

## 2023-01-20 NOTE — Therapy (Signed)
OUTPATIENT PHYSICAL THERAPY TREATMENT   Patient Name: Danny Cooper MRN: [id number] DOB:11-Sep-1980, 42 y.o., male Today's Date: 01/20/2023   PCP: [facility]  REFERRING PROVIDER:   Lonell Face, MD    END OF SESSION:  PT End of Session - 01/20/23 1354     Visit Number 8    Number of Visits 25    Date for PT Re-Evaluation 03/19/23    Authorization Type [employer]    Progress Note Due on Visit 10    PT Start Time 1402    PT Stop Time 1445    PT Time Calculation (min) 43 min    Equipment Utilized During Treatment Gait belt    Activity Tolerance Patient tolerated treatment well;Patient limited by fatigue    Behavior During Therapy WFL for tasks assessed/performed              Past Medical History:  Diagnosis Date   Diabetes mellitus without complication (HCC)    Spastic ataxia, hereditary (HCC)    Past Surgical History:  Procedure Laterality Date   CARDIAC SURGERY     age 68   There are no problems to display for this patient.   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 He reports HEP is going well. He reports no stumbles/falls. Pt reports no other updates/concerns.  Pt accompanied by: self  PERTINENT HISTORY:   The pt is a pleasant 42 yo male with hereditary spastic paraplegia ambulating with upright walker referred for PT eval. Pt would like to improve his strength, mobility, balance and gait. He was  previously seen in clinic years ago following diagnosis. Pt reports he returns now due to decline in his mobility that started about "a year to a year and a half ago." Pt uses a cane for short distances and uses his upright walker for longer distances. Pt has had 3-4 falls (maybe more) in the last six months. He reports no serious injury with falls. Falls have occurred with standing up from sitting and from tripping over his feet.  Pt reports decreased endurance, where  he can ambulate <7 minutes before needing to rest. He can stand with assistance for maybe 10-15 minutes. Pt was recently in ED 12/07/22 where he reports he was diagnoses with pneumonia. PMH consists of spastic ataxia, diabetes, seen ED earlier this month with concerns of weakness and lightheadedness where he was found to have pneumonia, hx of R shoulder bursitis per pt report  PAIN:  Are you having pain? No PRECAUTIONS: Fall  RED FLAGS: Bowel or bladder incontinence: Yes: reports urinary incontinence with progression of his condition    WEIGHT BEARING RESTRICTIONS: No  FALLS: Has patient fallen in last 6 months? Yes. Number of falls 3-4  PATIENT GOALS: improve mobility, strength, balance  OBJECTIVE: taken at eval unless specified otherwise    TODAY'S TREATMENT:                                                                                                                              DATE: 01/20/23  TE:   STS 1x5, 3x6 Requires rest breaks throughout - pt with some mild instability upon standing. Pt rates between easy-medium. Pt improved eccentric control into chair with repetition/cues.  With 3# AW donned each LE:  -seated LAQ 2x10-12 - pt reports feeling a little more resistance on the LLE -seated march 2x10 Rest breaks required between sets  -cable resisted AMB with 7.5lb fwd back x4 using upright walker. Slight difficulty initiating movement. Rest break following intervention  Standing alt step tap onto 6" and 12"  step with 3# AW donned each LE 2x60 sec  Seated addutor squeze with pball 10x difficult with technique/challenging  Seated adductor squeeze no pball 2x10    STS 2x    PATIENT EDUCATION: Pt educated throughout session about proper posture and technique with exercises. Improved exercise technique, movement at target joints, use of target muscles after min to mod verbal, visual, tactile cues. Marland Kitchen   HOME EXERCISE PROGRAM: 12/31/22: Access Code: [email or web address] URL: [email or web address] Date: 12/31/2022 Prepared by: Temple Pacini  Exercises - Standing Tandem Balance with Counter Support  - 2 x daily - 7 x weekly - 1 sets - 2 reps - 30 seconds hold - Romberg Stance  - 2 x daily - 7 x weekly - 1 sets - 2 reps - 30 seconds hold - written instruction to perform at support surface with UE support  Access Code: [email or web address] URL: [email or web address] Date: 12/25/2022 Prepared by: Temple Pacini  Exercises - Sit to Stand with Counter Support  - 1 x daily - 5 x weekly - 4 sets - 5 reps - Standing March with Counter Support  - 1 x daily - 5 x weekly - 2 sets - 10 reps GOALS:  SHORT TERM GOALS: Target date: 02/05/2023   Patient will be independent in home exercise program to improve strength/mobility for better functional independence with ADLs. Baseline: initiated Goal status: INITIAL   LONG TERM GOALS: Target date: 03/19/2023   Patient will increase FOTO score to equal  to or greater than  to demonstrate statistically significant improvement in mobility and quality of life.  Baseline: deferred due to system error; 01/06/23: 42 Goal status: INITIAL  2.  Patient (< 22 years old) will complete five times sit to stand test in < 10 seconds indicating increased LE strength and improved balance. Baseline: 21.7 sec Goal status: INITIAL  3.  Patient will increase Berg Balance score by > 6 points to demonstrate decreased fall risk during functional activities Baseline: 32 Goal  status: INITIAL  4.  Patient will increase 10 meter walk test to >1.45m/s as to improve gait speed for better community ambulation and to reduce fall risk. Baseline: 0.54 m/s Goal status: INITIAL  5.  Patient will increase six minute walk test distance to at least 1000 ft for progression to community ambulator and improved gait ability Baseline:  569 ft Goal status: INITIAL   ASSESSMENT:  CLINICAL IMPRESSION: Pt with excellent motivation to participate in session. He shows within-session improvement with eccentric control with STS, and overall improved ability to perform intervention compared to start of therapy. Pt found adductor squeezes challenging and had difficulty completing movement. Plan to continue to target this deficit. Pt will benefit from further skilled PT to address these impairments in order to increase functional mobility and decrease fall risk.  OBJECTIVE IMPAIRMENTS: Abnormal gait, decreased activity tolerance, decreased balance, decreased coordination, decreased endurance, decreased mobility, difficulty walking, decreased strength, impaired sensation, impaired tone, improper body mechanics, postural dysfunction, and pain.   ACTIVITY LIMITATIONS: carrying, lifting, standing, squatting, stairs, transfers, continence, and locomotion level  PARTICIPATION LIMITATIONS: cleaning, laundry, shopping, community activity, and yard work  PERSONAL FACTORS: Fitness, Time since onset of injury/illness/exacerbation, and 1-2 comorbidities: PMH consists of spastic ataxia, diabetes,hx of R shoulder bursitis per pt report  are also affecting patient's functional outcome.   REHAB POTENTIAL: Good  CLINICAL DECISION MAKING: Evolving/moderate complexity  EVALUATION COMPLEXITY: Moderate  PLAN:  PT FREQUENCY: 1-2x/week  PT DURATION: 12 weeks  PLANNED INTERVENTIONS: Therapeutic exercises, Therapeutic activity, Neuromuscular re-education, Balance training, Gait training, Patient/Family  education, Self Care, Joint mobilization, Stair training, Vestibular training, Canalith repositioning, Visual/preceptual remediation/compensation, Orthotic/Fit training, DME instructions, Dry Needling, Electrical stimulation, Spinal mobilization, Cryotherapy, Moist heat, Taping, Ultrasound, Manual therapy, and Re-evaluation  PLAN FOR NEXT SESSION: strengthening, gait, and balance   Baird Kay, PT 01/20/2023, 5:22 PM  5:22 PM, 01/20/23  Physical Therapist - [facility]  Outpatient Physical Therapy- Main Campus [phone]

## 2023-01-21 DIAGNOSIS — F331 Major depressive disorder, recurrent, moderate: Secondary | ICD-10-CM | POA: Diagnosis not present

## 2023-01-22 ENCOUNTER — Ambulatory Visit: Payer: 59

## 2023-01-22 DIAGNOSIS — R2681 Unsteadiness on feet: Secondary | ICD-10-CM | POA: Diagnosis not present

## 2023-01-22 DIAGNOSIS — R262 Difficulty in walking, not elsewhere classified: Secondary | ICD-10-CM | POA: Diagnosis not present

## 2023-01-22 DIAGNOSIS — R278 Other lack of coordination: Secondary | ICD-10-CM | POA: Diagnosis not present

## 2023-01-22 DIAGNOSIS — M6281 Muscle weakness (generalized): Secondary | ICD-10-CM | POA: Diagnosis not present

## 2023-01-22 NOTE — Therapy (Signed)
OUTPATIENT PHYSICAL THERAPY TREATMENT   Patient Name: Danny Cooper MRN: [id number] DOB:Aug 22, 1980, 42 y.o., male Today's Date: 01/22/2023   PCP: [facility]  REFERRING PROVIDER:   Lonell Face, MD    END OF SESSION:  PT End of Session - 01/22/23 1354     Visit Number 9    Number of Visits 25    Date for PT Re-Evaluation 03/19/23    Authorization Type [employer]    Progress Note Due on Visit 10    PT Start Time 1402    PT Stop Time 1445    PT Time Calculation (min) 43 min    Equipment Utilized During Treatment Gait belt    Activity Tolerance Patient tolerated treatment well    Behavior During Therapy WFL for tasks assessed/performed              Past Medical History:  Diagnosis Date   Diabetes mellitus without complication (HCC)    Spastic ataxia, hereditary (HCC)    Past Surgical History:  Procedure Laterality Date   CARDIAC SURGERY     age 49   There are no problems to display for this patient.   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 Reports he was parked by adjacent building to hospital, and that his walker became unsteady as he was walking up hill. He fell and hit his back on a fire hydrant, reports his bottom hi the ground but he didn't fall flat. He got up by using his walker like a cane. Pt says his back was hurting a bit following the fall but feeling better now, although another area still  hurts. He is unsure if he has a bruise. PT instructs pt to use hospital car shuttle or valet service back to his car for sessions to decrease risk of fall with long walk to car. Pt verbalizes understanding. Pt accompanied by: self  PERTINENT HISTORY:   The pt is a pleasant 42 yo male with hereditary spastic paraplegia ambulating with upright walker referred for PT eval. Pt would like to improve his  strength, mobility, balance and gait. He was previously seen in clinic years ago following diagnosis. Pt reports he returns now due to decline in his mobility that started about "a year to a year and a half ago." Pt uses a cane for short distances and uses his upright walker for longer distances. Pt has had 3-4 falls (maybe more) in the last six months. He reports no serious injury with falls. Falls have occurred with standing up from sitting and from tripping over his feet.  Pt reports decreased endurance, where he can ambulate <7 minutes before needing to rest. He can stand with assistance for maybe 10-15 minutes. Pt was recently in ED 12/07/22 where he reports he was diagnoses with pneumonia. PMH consists of spastic ataxia, diabetes, seen ED earlier this month with concerns of weakness and lightheadedness where he was found to have pneumonia, hx of R shoulder bursitis per pt report  PAIN:  Are you having pain? No PRECAUTIONS: Fall  RED FLAGS: Bowel or bladder incontinence: Yes: reports urinary incontinence with progression of his condition    WEIGHT BEARING RESTRICTIONS: No  FALLS: Has patient fallen in last 6 months? Yes. Number of falls 3-4  PATIENT GOALS: improve mobility, strength, balance  OBJECTIVE: taken at eval unless specified otherwise    TODAY'S TREATMENT:                                                                                                                              DATE: 01/22/23  Gait belt donned throughout to ensure pt safety   TE:   STS 1x8, Rates easy STS 2x10   Requires rest breaks throughout  but does exhibit onsistent improvement with eccentric control.   4# AW donned each LE: ambulation for strength/endurance x 444 ft. Rates easy   Issued soft and moderately soft theraputty to address grip impairment (pt reports dropping things often) x 2 min. PT instructs pt to start use of theraputty with no more than 5-10 min/day and plan to progress from there. PT also provides education about occupational therapy for addressing hand/fine motor impairments.   -cable resisted AMB with 7.5lb fwd back x4 using upright walker. Close CGA provided  NMR: Standing balance activity, pt at board, reaching outside of BOS to spell out alphabet x 9 min. Use of intermittent UE support throughout. Pt reports some difficulty with intervention.   PATIENT EDUCATION: Pt educated throughout session about proper posture and technique with exercises. Improved exercise technique, movement at target joints, use of target muscles after min to mod verbal, visual, tactile cues. Marland Kitchen   HOME EXERCISE PROGRAM: 12/31/22: Access Code: [email or web address] URL: [email or web address] Date: 12/31/2022 Prepared by: Temple Pacini  Exercises - Standing Tandem Balance with Counter Support  - 2 x daily - 7 x weekly - 1 sets - 2 reps - 30 seconds hold - Romberg Stance  - 2 x daily - 7 x weekly - 1 sets - 2 reps - 30 seconds  hold - written instruction to perform at support surface with UE support  Access Code: [facility] URL: [email or web address] Date: 12/25/2022 Prepared by: Temple Pacini  Exercises - Sit to Stand with Counter Support  - 1 x daily - 5 x weekly - 4 sets - 5 reps - Standing March with Counter Support  - 1 x daily - 5 x weekly - 2 sets - 10 reps GOALS:  SHORT TERM GOALS: Target date: 02/05/2023   Patient will be independent in home exercise program to improve strength/mobility for better functional independence with ADLs. Baseline: initiated Goal status:  INITIAL   LONG TERM GOALS: Target date: 03/19/2023   Patient will increase FOTO score to equal to or greater than  to demonstrate statistically significant improvement in mobility and quality of life.  Baseline: deferred due to system error; 01/06/23: 42 Goal status: INITIAL  2.  Patient (< 67 years old) will complete five times sit to stand test in < 10 seconds indicating increased LE strength and improved balance. Baseline: 21.7 sec Goal status: INITIAL  3.  Patient will increase Berg Balance score by > 6 points to demonstrate decreased fall risk during functional activities Baseline: 32 Goal status: INITIAL  4.  Patient will increase 10 meter walk test to >1.80m/s as to improve gait speed for better community ambulation and to reduce fall risk. Baseline: 0.54 m/s Goal status: INITIAL  5.  Patient will increase six minute walk test distance to at least 1000 ft for progression to community ambulator and improved gait ability Baseline:  569 ft Goal status: INITIAL   ASSESSMENT:  CLINICAL IMPRESSION: Pt continues to show improvement with STS/LE strength with increased volume of reps completed and consistent improvement with eccentric control during stand>sit. Pt had some difficulty with standing balance activity that required him to reach out of his BOS and challenge his limits of stability. Pt will benefit from further skilled PT to address these impairments in order to increase functional mobility and decrease fall risk.  OBJECTIVE IMPAIRMENTS: Abnormal gait, decreased activity tolerance, decreased balance, decreased coordination, decreased endurance, decreased mobility, difficulty walking, decreased strength, impaired sensation, impaired tone, improper body mechanics, postural dysfunction, and pain.   ACTIVITY LIMITATIONS: carrying, lifting, standing, squatting, stairs, transfers, continence, and locomotion level  PARTICIPATION LIMITATIONS: cleaning, laundry, shopping, community  activity, and yard work  PERSONAL FACTORS: Fitness, Time since onset of injury/illness/exacerbation, and 1-2 comorbidities: PMH consists of spastic ataxia, diabetes,hx of R shoulder bursitis per pt report  are also affecting patient's functional outcome.   REHAB POTENTIAL: Good  CLINICAL DECISION MAKING: Evolving/moderate complexity  EVALUATION COMPLEXITY: Moderate  PLAN:  PT FREQUENCY: 1-2x/week  PT DURATION: 12 weeks  PLANNED INTERVENTIONS: Therapeutic exercises, Therapeutic activity, Neuromuscular re-education, Balance training, Gait training, Patient/Family education, Self Care, Joint mobilization, Stair training, Vestibular training, Canalith repositioning, Visual/preceptual remediation/compensation, Orthotic/Fit training, DME instructions, Dry Needling, Electrical stimulation, Spinal mobilization, Cryotherapy, Moist heat, Taping, Ultrasound, Manual therapy, and Re-evaluation  PLAN FOR NEXT SESSION: strengthening, gait, and balance   Baird Kay, PT 01/22/2023, 4:55 PM  4:55 PM, 01/22/23  Physical Therapist - [facility]  Outpatient Physical Therapy- Main Campus [phone]

## 2023-01-24 DIAGNOSIS — F32A Depression, unspecified: Secondary | ICD-10-CM | POA: Diagnosis not present

## 2023-01-24 DIAGNOSIS — G114 Hereditary spastic paraplegia: Secondary | ICD-10-CM | POA: Diagnosis not present

## 2023-01-24 DIAGNOSIS — E119 Type 2 diabetes mellitus without complications: Secondary | ICD-10-CM | POA: Diagnosis not present

## 2023-01-24 DIAGNOSIS — Z6841 Body Mass Index (BMI) 40.0 and over, adult: Secondary | ICD-10-CM | POA: Diagnosis not present

## 2023-01-24 DIAGNOSIS — R030 Elevated blood-pressure reading, without diagnosis of hypertension: Secondary | ICD-10-CM | POA: Diagnosis not present

## 2023-01-24 DIAGNOSIS — G4733 Obstructive sleep apnea (adult) (pediatric): Secondary | ICD-10-CM | POA: Diagnosis not present

## 2023-01-27 ENCOUNTER — Ambulatory Visit: Payer: 59

## 2023-01-29 DIAGNOSIS — F331 Major depressive disorder, recurrent, moderate: Secondary | ICD-10-CM | POA: Diagnosis not present

## 2023-01-30 ENCOUNTER — Ambulatory Visit: Payer: 59

## 2023-02-03 ENCOUNTER — Ambulatory Visit: Payer: 59 | Attending: Neurology

## 2023-02-03 DIAGNOSIS — R2681 Unsteadiness on feet: Secondary | ICD-10-CM | POA: Insufficient documentation

## 2023-02-03 DIAGNOSIS — M6281 Muscle weakness (generalized): Secondary | ICD-10-CM | POA: Insufficient documentation

## 2023-02-03 DIAGNOSIS — R278 Other lack of coordination: Secondary | ICD-10-CM | POA: Diagnosis not present

## 2023-02-03 DIAGNOSIS — R262 Difficulty in walking, not elsewhere classified: Secondary | ICD-10-CM | POA: Insufficient documentation

## 2023-02-03 NOTE — Therapy (Signed)
OUTPATIENT PHYSICAL THERAPY TREATMENT/Physical Therapy Progress Note   Dates of reporting period  12/25/2022   to   02/03/2023    Patient Name: Danny Cooper MRN: [id number] DOB:1981-02-13, 42 y.o., male Today's Date: 02/03/2023   PCP: [facility]  REFERRING PROVIDER:   Lonell Face, MD    END OF SESSION:  PT End of Session - 02/03/23 1317     Visit Number 10    Number of Visits 25    Date for PT Re-Evaluation 03/19/23    Authorization Type [employer]    Progress Note Due on Visit 10    PT Start Time 1317    PT Stop Time 1400    PT Time Calculation (min) 43 min    Equipment Utilized During Treatment Gait belt    Activity Tolerance Patient tolerated treatment well    Behavior During Therapy WFL for tasks assessed/performed              Past Medical History:  Diagnosis Date   Diabetes mellitus without complication (HCC)    Spastic ataxia, hereditary (HCC)    Past Surgical History:  Procedure Laterality Date   CARDIAC SURGERY     age 42   There are no problems to display for this patient.   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 Pt accompanied by: self  PERTINENT HISTORY:   The pt is a pleasant 42 yo male with hereditary spastic paraplegia ambulating with upright walker referred for PT eval. Pt would like to improve his strength, mobility, balance and gait. He was previously seen in clinic years ago following diagnosis. Pt reports he returns now due to decline in his mobility  that started about "a year to a year and a half ago." Pt uses a cane for short distances and uses his upright walker for longer distances. Pt has had 3-4 falls (maybe more) in the last six months. He reports no serious injury with falls. Falls have occurred with standing up from sitting and from tripping over his feet.  Pt reports decreased endurance, where he can ambulate <7 minutes before needing to rest. He can stand with assistance  for maybe 10-15 minutes. Pt was recently in ED 12/07/22 where he reports he was diagnoses with pneumonia. PMH consists of spastic ataxia, diabetes, seen ED earlier this month with concerns of weakness and lightheadedness where he was found to have pneumonia, hx of R shoulder bursitis per pt report  PAIN:  Are you having pain? No PRECAUTIONS: Fall  RED FLAGS: Bowel or bladder incontinence: Yes: reports urinary incontinence with progression of his condition    WEIGHT BEARING RESTRICTIONS: No  FALLS: Has patient fallen in last 6 months? Yes. Number of falls 3-4  PATIENT GOALS: improve mobility, strength, balance  OBJECTIVE: taken at eval unless specified otherwise    TODAY'S TREATMENT:                                                                                                                              DATE: 02/03/23  Gait belt donned throughout to ensure pt safety    TA: Goal reassessment completed for progress note. Please refer to goal section and assessment for details.   PATIENT EDUCATION: Pt educated throughout session about proper posture and technique with exercises. Improved exercise technique, movement at target joints, use of target muscles after min to mod verbal, visual, tactile cues. Marland Kitchen   HOME EXERCISE PROGRAM: 12/31/22: Access Code: [email or web address] URL: [email or web address] Date: 12/31/2022 Prepared by: Temple Pacini  Exercises - Standing Tandem Balance with Counter Support  - 2 x daily - 7 x weekly - 1 sets - 2 reps - 30  seconds hold - Romberg Stance  - 2 x daily - 7 x weekly - 1 sets - 2 reps - 30 seconds hold - written instruction to perform at support surface with UE support  Access Code: [email or web address] URL: [email or web address] Date: 12/25/2022 Prepared by: Temple Pacini  Exercises - Sit to Stand with Counter Support  - 1 x daily - 5 x weekly - 4 sets - 5 reps - Standing March with Counter Support  - 1 x daily - 5 x weekly - 2 sets - 10 reps GOALS:  SHORT TERM GOALS: Target date: 02/05/2023   Patient will be independent in home exercise program to improve strength/mobility for better functional independence with ADLs. Baseline: initiated Goal status: IN PROGRESS   LONG TERM GOALS: Target date: 03/19/2023   Patient will increase FOTO score to equal to or greater than 51  to demonstrate statistically significant improvement in mobility and quality of life.  Baseline: deferred due to system error; 01/06/23: 42; 10/7: 57 Goal status: MET  2.  Patient (< 80 years old) will complete five times sit to stand test in < 10 seconds indicating increased LE strength and improved balance. Baseline: 21.7 sec; 02/03/23: 21 seconds hands-free (improved eccentric control) Goal status: IN PROGRESS  3.  Patient will increase Berg Balance score by > 6 points to demonstrate decreased fall risk during functional activities Baseline: 32; 02/03/23 36 (most difficulty with SLB, tandem stance)  Goal status: IN  PROGRESS  4.  Patient will increase 10 meter walk test to >1.69m/s as to improve gait speed for better community ambulation and to reduce fall risk. Baseline: 0.54 m/s; 02/03/23: 0.59 m/s  Goal status: IN PROGRESS  5.  Patient will increase six minute walk test distance to at least 1000 ft for progression to community ambulator and improved gait ability Baseline:  569 ft; 02/03/2023: 746 ft Goal status: IN PROGRESS    ASSESSMENT:  CLINICAL IMPRESSION: Goal reassessment completed for progress note. Pt  making gains AEB meeting FOTO goal, and improving , BERG, and performance. These gains indicate increased gait ability/functional capacity, improved balance, and gait speed. While pt making gains with majority of testing, his 5xSTS score is unchanged, although he did exhibit more eccentric control with stand>sit.Pt will benefit from further skilled PT to address these impairments in order to increase functional mobility and decrease fall risk.  OBJECTIVE IMPAIRMENTS: Abnormal gait, decreased activity tolerance, decreased balance, decreased coordination, decreased endurance, decreased mobility, difficulty walking, decreased strength, impaired sensation, impaired tone, improper body mechanics, postural dysfunction, and pain.   ACTIVITY LIMITATIONS: carrying, lifting, standing, squatting, stairs, transfers, continence, and locomotion level  PARTICIPATION LIMITATIONS: cleaning, laundry, shopping, community activity, and yard work  PERSONAL FACTORS: Fitness, Time since onset of injury/illness/exacerbation, and 1-2 comorbidities: PMH consists of spastic ataxia, diabetes,hx of R shoulder bursitis per pt report  are also affecting patient's functional outcome.   REHAB POTENTIAL: Good  CLINICAL DECISION MAKING: Evolving/moderate complexity  EVALUATION COMPLEXITY: Moderate  PLAN:  PT FREQUENCY: 1-2x/week  PT DURATION: 12 weeks  PLANNED INTERVENTIONS: Therapeutic exercises, Therapeutic activity, Neuromuscular re-education, Balance training, Gait training, Patient/Family education, Self Care, Joint mobilization, Stair training, Vestibular training, Canalith repositioning, Visual/preceptual remediation/compensation, Orthotic/Fit training, DME instructions, Dry Needling, Electrical stimulation, Spinal mobilization, Cryotherapy, Moist heat, Taping, Ultrasound, Manual therapy, and Re-evaluation  PLAN FOR NEXT SESSION: strengthening, gait, and balance   Baird Kay, PT 02/03/2023, 5:21  PM  5:21 PM, 02/03/23  Physical Therapist - [facility]  Outpatient Physical Therapy- Main Campus [phone]

## 2023-02-05 ENCOUNTER — Ambulatory Visit: Payer: 59

## 2023-02-10 ENCOUNTER — Ambulatory Visit: Payer: 59

## 2023-02-12 ENCOUNTER — Ambulatory Visit: Payer: 59

## 2023-02-12 DIAGNOSIS — F331 Major depressive disorder, recurrent, moderate: Secondary | ICD-10-CM | POA: Diagnosis not present

## 2023-02-17 ENCOUNTER — Ambulatory Visit: Payer: 59

## 2023-02-18 DIAGNOSIS — F331 Major depressive disorder, recurrent, moderate: Secondary | ICD-10-CM | POA: Diagnosis not present

## 2023-02-19 ENCOUNTER — Ambulatory Visit: Payer: 59

## 2023-02-19 DIAGNOSIS — R262 Difficulty in walking, not elsewhere classified: Secondary | ICD-10-CM | POA: Diagnosis not present

## 2023-02-19 DIAGNOSIS — R278 Other lack of coordination: Secondary | ICD-10-CM

## 2023-02-19 DIAGNOSIS — M6281 Muscle weakness (generalized): Secondary | ICD-10-CM | POA: Diagnosis not present

## 2023-02-19 DIAGNOSIS — R2681 Unsteadiness on feet: Secondary | ICD-10-CM | POA: Diagnosis not present

## 2023-02-19 NOTE — Therapy (Signed)
OUTPATIENT PHYSICAL THERAPY TREATMENT   Patient Name: Danny Cooper MRN: [id number] DOB:1981/02/27, 42 y.o., male Today's Date: 02/19/2023   PCP: [facility]  REFERRING PROVIDER:   Lonell Face, MD    END OF SESSION:  PT End of Session - 02/19/23 1422     Visit Number 11    Number of Visits 25    Date for PT Re-Evaluation 03/19/23    Authorization Type [employer]    Progress Note Due on Visit 10    PT Start Time 1317    PT Stop Time 1400    PT Time Calculation (min) 43 min    Equipment Utilized During Treatment Gait belt    Activity Tolerance Patient tolerated treatment well    Behavior During Therapy WFL for tasks assessed/performed               Past Medical History:  Diagnosis Date   Diabetes mellitus without complication (HCC)    Spastic ataxia, hereditary (HCC)    Past Surgical History:  Procedure Laterality Date   CARDIAC SURGERY     age 41   There are no problems to display for this patient.   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  Pt accompanied by: self  PERTINENT HISTORY:   The pt is a pleasant 42 yo male with hereditary spastic paraplegia ambulating with upright walker referred for PT eval. Pt would like to improve his strength, mobility, balance and gait. He was previously seen in clinic years ago following diagnosis. Pt reports he returns now due to decline in his mobility  that started about "a year to a year and a half ago." Pt uses a cane for short distances and uses his upright walker for longer distances. Pt has had 3-4 falls (maybe more) in the last six months. He reports no serious injury with falls. Falls have occurred with standing up from sitting and from tripping over his feet.  Pt reports decreased endurance, where he can ambulate <7 minutes before needing to rest. He can stand with assistance for maybe  10-15 minutes. Pt was recently in ED 12/07/22 where he reports he was diagnoses with pneumonia. PMH consists of spastic ataxia, diabetes, seen ED earlier this month with concerns of weakness and lightheadedness where he was found to have pneumonia, hx of R shoulder bursitis per pt report  PAIN:  Are you having pain? No PRECAUTIONS: Fall  RED FLAGS: Bowel or bladder incontinence: Yes: reports urinary incontinence with progression of his condition    WEIGHT BEARING RESTRICTIONS: No  FALLS: Has patient fallen in last 6 months? Yes. Number of falls 3-4  PATIENT GOALS: improve mobility, strength, balance  OBJECTIVE: taken at eval unless specified otherwise    TODAY'S TREATMENT:                                                                                                                              DATE: 02/19/23  Gait belt donned throughout to ensure pt safety   TE:  STS 1x8, 1x10 - very slight instability upon standing   4# AW donned each LE: ambulation for strength/endurance x 296 ft. Cuing for heel-toe sequencing. Rates medium due to coordination challenge with heel-toe sequence  Requires rest breaks throughout  but does exhibit consistent improvement with eccentric control.   LAQ with 4# aw 2x10 each LE  - easier on L side   Seated march with 4# aw 1x10, 1x6 each LE    STS 1x6  Heel raise 2x15 bilat  Nustep lvl 1-3 x approx 5 min. Rates up to medium to promote LE strength/endurance  STS 4x - increased unsteadiness with standing,  likely due to fatigue of BLE - min a to correct for LOB in standing  Pt generally required CGA with majority of interventions    PATIENT EDUCATION: Pt educated throughout session about proper posture and technique with exercises. Improved exercise technique, movement at target joints, use of target muscles after min to mod verbal, visual, tactile cues. Marland Kitchen   HOME EXERCISE PROGRAM: 12/31/22: Access Code: [email or web address] URL: [email or web address] Date: 12/31/2022 Prepared by: Temple Pacini  Exercises - Standing Tandem Balance with Counter Support  - 2 x daily - 7 x weekly - 1 sets - 2 reps - 30 seconds hold - Romberg Stance  - 2 x daily - 7 x weekly - 1 sets - 2 reps - 30 seconds hold - written instruction to perform at support surface with UE support  Access Code: [email or web address] URL: [email or web address] Date: 12/25/2022 Prepared by: Temple Pacini  Exercises - Sit to Stand with Counter Support  - 1 x daily - 5 x weekly - 4 sets - 5 reps - Standing March with Counter Support  - 1 x daily - 5 x weekly - 2 sets - 10 reps GOALS:  SHORT TERM GOALS: Target date: 02/05/2023   Patient will be independent in home exercise program to improve strength/mobility for better functional independence with ADLs. Baseline: initiated Goal status: IN PROGRESS   LONG TERM GOALS: Target date:  03/19/2023   Patient will increase FOTO score to equal to or greater than 51  to demonstrate statistically significant improvement in mobility and quality of life.  Baseline: deferred due to system error; 01/06/23: 42; 10/7: 57 Goal status: MET  2.  Patient (< 32 years old) will complete five times sit to stand test in < 10 seconds indicating increased LE strength and improved balance. Baseline: 21.7 sec; 02/03/23: 21 seconds hands-free (improved eccentric control) Goal status: IN PROGRESS  3.  Patient will increase Berg Balance score by > 6 points to demonstrate decreased fall risk during  functional activities Baseline: 32; 02/03/23 36 (most difficulty with SLB, tandem stance)  Goal status: IN PROGRESS  4.  Patient will increase 10 meter walk test to >1.52m/s as to improve gait speed for better community ambulation and to reduce fall risk. Baseline: 0.54 m/s; 02/03/23: 0.59 m/s  Goal status: IN PROGRESS  5.  Patient will increase six minute walk test distance to at least 1000 ft for progression to community ambulator and improved gait ability Baseline:  569 ft; 02/03/2023: 746 ft Goal status: IN PROGRESS    ASSESSMENT:  CLINICAL IMPRESSION: Continued focus on LE strengthening and endurance. Pt generally rated interventions no greater than medium. Pt initially exhibited improved postural stability in standing with STS, but did require up to min assist to correct for LOB with last set at end of session. This was most likely due to LE fatigue. Will continue to progress. Pt will benefit from further skilled PT to address these impairments in order to increase functional mobility and decrease fall risk.  OBJECTIVE IMPAIRMENTS: Abnormal gait, decreased activity tolerance, decreased balance, decreased coordination, decreased endurance, decreased mobility, difficulty walking, decreased strength, impaired sensation, impaired tone, improper body mechanics, postural dysfunction, and pain.   ACTIVITY LIMITATIONS: carrying, lifting, standing, squatting, stairs, transfers, continence, and locomotion level  PARTICIPATION LIMITATIONS: cleaning, laundry, shopping, community activity, and yard work  PERSONAL FACTORS: Fitness, Time since onset of injury/illness/exacerbation, and 1-2 comorbidities: PMH consists of spastic ataxia, diabetes,hx of R shoulder bursitis per pt report  are also affecting patient's functional outcome.   REHAB POTENTIAL: Good  CLINICAL DECISION MAKING: Evolving/moderate complexity  EVALUATION COMPLEXITY: Moderate  PLAN:  PT FREQUENCY: 1-2x/week  PT DURATION: 12  weeks  PLANNED INTERVENTIONS: Therapeutic exercises, Therapeutic activity, Neuromuscular re-education, Balance training, Gait training, Patient/Family education, Self Care, Joint mobilization, Stair training, Vestibular training, Canalith repositioning, Visual/preceptual remediation/compensation, Orthotic/Fit training, DME instructions, Dry Needling, Electrical stimulation, Spinal mobilization, Cryotherapy, Moist heat, Taping, Ultrasound, Manual therapy, and Re-evaluation  PLAN FOR NEXT SESSION: strengthening, gait, and balance   Baird Kay, PT 02/19/2023, 2:27 PM  2:27 PM, 02/19/23  Physical Therapist - [facility]  Outpatient Physical Therapy- Main Campus [phone]

## 2023-02-24 ENCOUNTER — Ambulatory Visit: Payer: 59

## 2023-02-26 ENCOUNTER — Ambulatory Visit: Payer: 59

## 2023-02-26 DIAGNOSIS — M6281 Muscle weakness (generalized): Secondary | ICD-10-CM | POA: Diagnosis not present

## 2023-02-26 DIAGNOSIS — R262 Difficulty in walking, not elsewhere classified: Secondary | ICD-10-CM

## 2023-02-26 DIAGNOSIS — R2681 Unsteadiness on feet: Secondary | ICD-10-CM | POA: Diagnosis not present

## 2023-02-26 DIAGNOSIS — R278 Other lack of coordination: Secondary | ICD-10-CM | POA: Diagnosis not present

## 2023-02-26 NOTE — Therapy (Signed)
OUTPATIENT PHYSICAL THERAPY TREATMENT   Patient Name: Danny Cooper MRN: [id number] DOB:1980/06/10, 42 y.o., male Today's Date: 02/26/2023   PCP: [facility]  REFERRING PROVIDER:   Lonell Face, MD    END OF SESSION:  PT End of Session - 02/26/23 1415     Visit Number 12    Number of Visits 25    Date for PT Re-Evaluation 03/19/23    Authorization Type [employer]    Progress Note Due on Visit 10    PT Start Time 1327    PT Stop Time 1402    PT Time Calculation (min) 35 min    Equipment Utilized During Treatment Gait belt    Activity Tolerance Patient tolerated treatment well    Behavior During Therapy WFL for tasks assessed/performed                Past Medical History:  Diagnosis Date   Diabetes mellitus without complication (HCC)    Spastic ataxia, hereditary (HCC)    Past Surgical History:  Procedure Laterality Date   CARDIAC SURGERY     age 80   There are no problems to display for this patient.   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  He had difficulty buttoning his pants to get ready for PT today and difficulty with fixing part of his walker, making him somewhat late to session as a result (pt also delayed due to lack of parking availability). He reports difficulty with dressing is new due to decreased hand function. He is interested in an OT consult to address these deficits. He reports some difficulty at his son's [employer] festival due to  challenge with ambulating outside on uneven surfaces. Pt reports no other changes, concerns, pain or falls.   Pt accompanied by: self  PERTINENT HISTORY:   The pt is a pleasant 41 yo male with hereditary spastic paraplegia ambulating with upright walker referred for PT eval. Pt would like to improve his strength, mobility, balance and gait. He was previously seen in clinic years ago following diagnosis. Pt reports he returns now due to decline in his mobility that started  about "a year to a year and a half ago." Pt uses a cane for short distances and uses his upright walker for longer distances. Pt has had 3-4 falls (maybe more) in the last six months. He reports no serious injury with falls. Falls have occurred with standing up from sitting and from tripping over his feet.  Pt reports decreased endurance, where he can ambulate <7 minutes before needing to rest. He can stand with assistance for maybe 10-15 minutes. Pt was recently in ED 12/07/22 where he reports he was diagnoses with pneumonia. PMH consists of spastic ataxia, diabetes, seen ED earlier this month with concerns of weakness and lightheadedness where he was found to have pneumonia, hx of R shoulder bursitis per pt report  PAIN:  Are you having pain? No PRECAUTIONS: Fall  RED FLAGS: Bowel or bladder incontinence: Yes: reports urinary incontinence with progression of his condition    WEIGHT BEARING RESTRICTIONS: No  FALLS: Has patient fallen in last 6 months? Yes. Number of falls 3-4  PATIENT GOALS: improve mobility, strength, balance  OBJECTIVE: taken at eval unless specified otherwise    TODAY'S TREATMENT:                                                                                                                              DATE: 02/26/23   TE:  Rest break at start of visit as pt teary during session due to events prior to PT (see above).  PT discussed with pt reasoning for referral request for OT  Seated: LAQ  2x10 - rates easy  --repeats with 3# aw donned each LE 1x10  - repeats with 5# aw donned each LE 1x10 - rates medium  Seated march with 5# AW each LE 2x10 - harder on RLE     PATIENT EDUCATION: Pt educated throughout session about proper posture and technique with exercises. Improved exercise technique, movement at target joints, use of target muscles after min to mod verbal, visual, tactile cues. Plan to send referral request for OT to physician  .   HOME EXERCISE PROGRAM: 12/31/22: Access Code: [email or web address] URL: [email or web address] Date: 12/31/2022 Prepared by: Temple Pacini  Exercises - Standing Tandem Balance with Counter Support  - 2 x daily - 7 x weekly - 1 sets - 2 reps - 30 seconds hold - Romberg Stance  - 2 x daily - 7 x weekly - 1 sets - 2 reps - 30 seconds hold - written instruction to perform at support surface with UE support  Access Code: [email or web address] URL: [email or web address] Date: 12/25/2022 Prepared by: Temple Pacini  Exercises - Sit to Stand with Counter Support  - 1 x daily - 5 x weekly - 4 sets - 5 reps - Standing March with Counter Support  - 1 x daily - 5 x weekly - 2 sets - 10 reps GOALS:  SHORT TERM GOALS: Target date: 02/05/2023   Patient will be independent in home exercise program to  improve strength/mobility for better functional independence with ADLs. Baseline: initiated Goal status: IN PROGRESS   LONG TERM GOALS: Target date: 03/19/2023   Patient will increase FOTO score to equal to or greater than 51  to demonstrate statistically significant improvement in mobility and quality of life.  Baseline: deferred due to system error; 01/06/23: 42; 10/7: 57 Goal status: MET  2.  Patient (< 79 years old) will complete five times sit to stand test in < 10 seconds indicating increased LE strength and improved balance. Baseline: 21.7 sec; 02/03/23: 21 seconds hands-free (improved eccentric control) Goal status: IN PROGRESS  3.   Patient will increase Berg Balance score by > 6 points to demonstrate decreased fall risk during functional activities Baseline: 32; 02/03/23 36 (most difficulty with SLB, tandem stance)  Goal status: IN PROGRESS  4.  Patient will increase 10 meter walk test to >1.74m/s as to improve gait speed for better community ambulation and to reduce fall risk. Baseline: 0.54 m/s; 02/03/23: 0.59 m/s  Goal status: IN PROGRESS  5.  Patient will increase six minute walk test distance to at least 1000 ft for progression to community ambulator and improved gait ability Baseline:  569 ft; 02/03/2023: 746 ft Goal status: IN PROGRESS    ASSESSMENT:  CLINICAL IMPRESSION: Session somewhat limited today (see above), however pt motivated to participate. Focus was on progressive LE therex in seated to target quads and hip flexors. Pt rated use of 5# AW as a moderate challenge. PT has filled out referral request form and sending to pt's physician due to decrease in pt's hand function. Plan next visit to further progress strength and address balance over uneven surface. The pt will benefit from further skilled PT to address these impairments in order to increase functional mobility and decrease fall risk.  OBJECTIVE IMPAIRMENTS: Abnormal gait, decreased activity tolerance, decreased balance, decreased coordination, decreased endurance, decreased mobility, difficulty walking, decreased strength, impaired sensation, impaired tone, improper body mechanics, postural dysfunction, and pain.   ACTIVITY LIMITATIONS: carrying, lifting, standing, squatting, stairs, transfers, continence, and locomotion level  PARTICIPATION LIMITATIONS: cleaning, laundry, shopping, community activity, and yard work  PERSONAL FACTORS: Fitness, Time since onset of injury/illness/exacerbation, and 1-2 comorbidities: PMH consists of spastic ataxia, diabetes,hx of R shoulder bursitis per pt report  are also affecting patient's functional outcome.    REHAB POTENTIAL: Good  CLINICAL DECISION MAKING: Evolving/moderate complexity  EVALUATION COMPLEXITY: Moderate  PLAN:  PT FREQUENCY: 1-2x/week  PT DURATION: 12 weeks  PLANNED INTERVENTIONS: Therapeutic exercises, Therapeutic activity, Neuromuscular re-education, Balance training, Gait training, Patient/Family education, Self Care, Joint mobilization, Stair training, Vestibular training, Canalith repositioning, Visual/preceptual remediation/compensation, Orthotic/Fit training, DME instructions, Dry Needling, Electrical stimulation, Spinal mobilization, Cryotherapy, Moist heat, Taping, Ultrasound, Manual therapy, and Re-evaluation  PLAN FOR NEXT SESSION: strengthening, gait, and balance, uneven surface balance interventions   Baird Kay, PT 02/26/2023, 2:26 PM  2:26 PM, 02/26/23  Physical Therapist - [facility]  Outpatient Physical Therapy- Main Campus [phone]

## 2023-03-03 ENCOUNTER — Ambulatory Visit: Payer: 59 | Attending: Neurology

## 2023-03-03 DIAGNOSIS — R262 Difficulty in walking, not elsewhere classified: Secondary | ICD-10-CM | POA: Insufficient documentation

## 2023-03-03 DIAGNOSIS — R278 Other lack of coordination: Secondary | ICD-10-CM | POA: Diagnosis not present

## 2023-03-03 DIAGNOSIS — G114 Hereditary spastic paraplegia: Secondary | ICD-10-CM | POA: Insufficient documentation

## 2023-03-03 DIAGNOSIS — R2681 Unsteadiness on feet: Secondary | ICD-10-CM | POA: Diagnosis not present

## 2023-03-03 DIAGNOSIS — M6281 Muscle weakness (generalized): Secondary | ICD-10-CM | POA: Diagnosis not present

## 2023-03-03 NOTE — Therapy (Signed)
OUTPATIENT PHYSICAL THERAPY TREATMENT   Patient Name: Danny Cooper MRN: [id number] DOB:05/28/1980, 42 y.o., male Today's Date: 03/03/2023   PCP: [facility]  REFERRING PROVIDER:   Lonell Face, MD    END OF SESSION:  PT End of Session - 03/03/23 1313     Visit Number 13    Number of Visits 25    Date for PT Re-Evaluation 03/19/23    Authorization Type [employer]    Progress Note Due on Visit 10    PT Start Time 1316    PT Stop Time 1358    PT Time Calculation (min) 42 min    Equipment Utilized During Treatment Gait belt    Activity Tolerance Patient tolerated treatment well    Behavior During Therapy WFL for tasks assessed/performed                 Past Medical History:  Diagnosis Date   Diabetes mellitus without complication (HCC)    Spastic ataxia, hereditary (HCC)    Past Surgical History:  Procedure Laterality Date   CARDIAC SURGERY     age 6   There are no problems to display for this patient.   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  Pt accompanied by: self  PERTINENT HISTORY:   The pt is a pleasant 42 yo male with hereditary spastic paraplegia ambulating with upright walker referred for PT eval. Pt would like to improve his strength, mobility, balance and gait. He was previously seen in clinic years ago following diagnosis. Pt reports he returns now due to decline in his mobility that started about "a year  to a year and a half ago." Pt uses a cane for short distances and uses his upright walker for longer distances. Pt has had 3-4 falls (maybe more) in the last six months. He reports no serious injury with falls. Falls have occurred with standing up from sitting and from tripping over his feet.  Pt reports decreased endurance, where he can ambulate <7 minutes before needing to rest. He can stand with assistance for maybe 10-15 minutes. Pt was  recently in ED 12/07/22 where he reports he was diagnoses with pneumonia. PMH consists of spastic ataxia, diabetes, seen ED earlier this month with concerns of weakness and lightheadedness where he was found to have pneumonia, hx of R shoulder bursitis per pt report  PAIN:  Are you having pain? No PRECAUTIONS: Fall  RED FLAGS: Bowel or bladder incontinence: Yes: reports urinary incontinence with progression of his condition    WEIGHT BEARING RESTRICTIONS: No  FALLS: Has patient fallen in last 6 months? Yes. Number of falls 3-4  PATIENT GOALS: improve mobility, strength, balance  OBJECTIVE: taken at eval unless specified otherwise    TODAY'S TREATMENT:                                                                                                                              DATE: 03/03/23  Gait belt donned throughout to ensure pt safety  NMR: Obstacle course over uneven surfaces and around cones (step onto and off 2 airex pads) x several minutes using RW, wearing 5# aw each LE  Step-length intervention over 10 meters: with 5# ankle weights donned, with RW Trial 1- 31 steps Trial 2- 26 steps Trial 3- 25 steps Trial 4-  24 steps  One foot on floor one on 6" step 2x30 sec each LE  One foot on floor, one foot on airex 30 sec each LE --then progressed to horizontal and vertical head turns 10x each LE  Comments: intermittent UE support throughout  TE: 5# AW donned:  LAQ 10x each LE  STS 5x Seated March 12x each LE  STS 10x    PATIENT  EDUCATION: Pt educated throughout session about proper posture and technique with exercises. Improved exercise technique, movement at target joints, use of target muscles after min to mod verbal, visual, tactile cues.  Marland Kitchen   HOME EXERCISE PROGRAM: 12/31/22: Access Code: [email or web address] URL: [email or web address] Date: 12/31/2022 Prepared by: Temple Pacini  Exercises - Standing Tandem Balance with Counter Support  - 2 x daily - 7 x weekly - 1 sets - 2 reps - 30 seconds hold - Romberg Stance  - 2 x daily - 7 x weekly - 1 sets - 2 reps - 30 seconds hold - written instruction to perform at support surface with UE support  Access Code: [email or web address] URL: [email or web address] Date: 12/25/2022 Prepared by: Temple Pacini  Exercises - Sit to Stand with Counter Support  - 1 x daily - 5 x weekly - 4 sets - 5 reps - Standing March with Counter Support  - 1 x daily - 5 x weekly - 2 sets - 10 reps GOALS:  SHORT TERM GOALS: Target date: 02/05/2023   Patient will be independent in home exercise program to improve strength/mobility for better functional independence with ADLs. Baseline: initiated Goal status: IN PROGRESS   LONG TERM GOALS: Target date: 03/19/2023   Patient will increase FOTO score to equal to or greater than 51  to demonstrate statistically  significant improvement in mobility and quality of life.  Baseline: deferred due to system error; 01/06/23: 42; 10/7: 57 Goal status: MET  2.  Patient (< 23 years old) will complete five times sit to stand test in < 10 seconds indicating increased LE strength and improved balance. Baseline: 21.7 sec; 02/03/23: 21 seconds hands-free (improved eccentric control) Goal status: IN PROGRESS  3.  Patient will increase Berg Balance score by > 6 points to demonstrate decreased fall risk during functional activities Baseline: 32; 02/03/23 36 (most difficulty with SLB, tandem stance)  Goal status: IN PROGRESS  4.  Patient will increase 10  meter walk test to >1.11m/s as to improve gait speed for better community ambulation and to reduce fall risk. Baseline: 0.54 m/s; 02/03/23: 0.59 m/s  Goal status: IN PROGRESS  5.  Patient will increase six minute walk test distance to at least 1000 ft for progression to community ambulator and improved gait ability Baseline:  569 ft; 02/03/2023: 746 ft Goal status: IN PROGRESS    ASSESSMENT:  CLINICAL IMPRESSION: Initiated dynamic balance training. Pt moderately challenged with obstacle course: stepping onto and off of compliant surfaces. While pt found this to be a moderate challenge but required no more than CGA. He used slightly more support (intermittent UE support) with static balance interventions with airex. Plan to continue to address these deficits future visits. The pt will benefit from further skilled PT to address these impairments in order to increase functional mobility and decrease fall risk.  OBJECTIVE IMPAIRMENTS: Abnormal gait, decreased activity tolerance, decreased balance, decreased coordination, decreased endurance, decreased mobility, difficulty walking, decreased strength, impaired sensation, impaired tone, improper body mechanics, postural dysfunction, and pain.   ACTIVITY LIMITATIONS: carrying, lifting, standing, squatting, stairs, transfers, continence, and locomotion level  PARTICIPATION LIMITATIONS: cleaning, laundry, shopping, community activity, and yard work  PERSONAL FACTORS: Fitness, Time since onset of injury/illness/exacerbation, and 1-2 comorbidities: PMH consists of spastic ataxia, diabetes,hx of R shoulder bursitis per pt report  are also affecting patient's functional outcome.   REHAB POTENTIAL: Good  CLINICAL DECISION MAKING: Evolving/moderate complexity  EVALUATION COMPLEXITY: Moderate  PLAN:  PT FREQUENCY: 1-2x/week  PT DURATION: 12 weeks  PLANNED INTERVENTIONS: Therapeutic exercises, Therapeutic activity, Neuromuscular re-education, Balance  training, Gait training, Patient/Family education, Self Care, Joint mobilization, Stair training, Vestibular training, Canalith repositioning, Visual/preceptual remediation/compensation, Orthotic/Fit training, DME instructions, Dry Needling, Electrical stimulation, Spinal mobilization, Cryotherapy, Moist heat, Taping, Ultrasound, Manual therapy, and Re-evaluation  PLAN FOR NEXT SESSION: strengthening, gait, static balance, uneven surface balance interventions   Baird Kay, PT 03/03/2023, 4:53 PM  4:53 PM, 03/03/23  Physical Therapist - [facility]  Outpatient Physical Therapy- Main Campus [phone]

## 2023-03-06 DIAGNOSIS — F331 Major depressive disorder, recurrent, moderate: Secondary | ICD-10-CM | POA: Diagnosis not present

## 2023-03-10 ENCOUNTER — Ambulatory Visit: Payer: 59

## 2023-03-12 ENCOUNTER — Ambulatory Visit: Payer: 59

## 2023-03-12 DIAGNOSIS — R278 Other lack of coordination: Secondary | ICD-10-CM

## 2023-03-12 DIAGNOSIS — R2681 Unsteadiness on feet: Secondary | ICD-10-CM

## 2023-03-12 DIAGNOSIS — R262 Difficulty in walking, not elsewhere classified: Secondary | ICD-10-CM | POA: Diagnosis not present

## 2023-03-12 DIAGNOSIS — G114 Hereditary spastic paraplegia: Secondary | ICD-10-CM | POA: Diagnosis not present

## 2023-03-12 DIAGNOSIS — M6281 Muscle weakness (generalized): Secondary | ICD-10-CM | POA: Diagnosis not present

## 2023-03-12 NOTE — Therapy (Unsigned)
OUTPATIENT PHYSICAL THERAPY TREATMENT   Patient Name: Danny Cooper MRN: [id number] DOB:May 09, 1980, 42 y.o., male Today's Date: 03/13/2023   PCP: [facility]  REFERRING PROVIDER:   Lonell Face, MD    END OF SESSION:  PT End of Session - 03/12/23 1325     Visit Number 14    Number of Visits 25    Date for PT Re-Evaluation 03/19/23    Authorization Type [employer]    Progress Note Due on Visit 10    PT Start Time 1328    PT Stop Time 1400    PT Time Calculation (min) 32 min    Equipment Utilized During Treatment Gait belt    Activity Tolerance Patient tolerated treatment well    Behavior During Therapy WFL for tasks assessed/performed                 Past Medical History:  Diagnosis Date   Diabetes mellitus without complication (HCC)    Spastic ataxia, hereditary (HCC)    Past Surgical History:  Procedure Laterality Date   CARDIAC SURGERY     age 13   There are no problems to display for this patient.   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 He reports 1 fall about a week ago. He states it wasn't a complete fall and that he caught himself on his coffee table before making it to the floor. He reports no injury. Exercising has been going OK.   Pt accompanied by: Danny Cooper  PERTINENT HISTORY:   The pt is a pleasant 41 yo male with hereditary spastic paraplegia ambulating with upright walker referred for PT eval. Pt would like to improve his strength,  mobility, balance and gait. He was previously seen in clinic years ago following diagnosis. Pt reports he returns now due to decline in his mobility that started about "a year to a year and a half ago." Pt uses a cane for short distances and uses his upright walker for longer distances. Pt has had 3-4 falls (maybe more) in the last six months. He reports no serious injury with falls. Falls have occurred with standing  up from sitting and from tripping over his feet.  Pt reports decreased endurance, where he can ambulate <7 minutes before needing to rest. He can stand with assistance for maybe 10-15 minutes. Pt was recently in ED 12/07/22 where he reports he was diagnoses with pneumonia. PMH consists of spastic ataxia, diabetes, seen ED earlier this month with concerns of weakness and lightheadedness where he was found to have pneumonia, hx of R shoulder bursitis per pt report  PAIN:  Are you having pain? No PRECAUTIONS: Fall  RED FLAGS: Bowel or bladder incontinence: Yes: reports urinary incontinence with progression of his condition    WEIGHT BEARING RESTRICTIONS: No  FALLS: Has patient fallen in last 6 months? Yes. Number of falls 3-4  PATIENT GOALS: improve mobility, strength, balance  OBJECTIVE: taken at eval unless specified otherwise    TODAY'S TREATMENT:                                                                                                                              DATE: 03/13/23  Gait belt donned throughout for pt safety  NMR:  Obstacle course over uneven surfaces and around cones and step-tap onto green pad with each LE  x several minutes using RW -first completes without ankle weights, then repeats wearing 5# aw each LE  Cone taps with BUE support and 5# AW each LE 2x10    TE: 5# AW donned:  LAQ 12x each LE  STS 10x LAQ 12x each LE  STS 10x  Standing hamstring curl 3x10 each LE  STS 6x      PATIENT EDUCATION: Pt educated throughout session about proper  posture and technique with exercises. Improved exercise technique, movement at target joints, use of target muscles after min to mod verbal, visual, tactile cues.  Marland Kitchen   HOME EXERCISE PROGRAM: 12/31/22: Access Code: [email or web address] URL: [email or web address] Date: 12/31/2022 Prepared by: Temple Pacini  Exercises - Standing Tandem Balance with Counter Support  - 2 x daily - 7 x weekly - 1 sets - 2 reps - 30 seconds hold - Romberg Stance  - 2 x daily - 7 x weekly - 1 sets - 2 reps - 30 seconds hold - written instruction to perform at support surface with UE support  Access Code: [email or web address] URL: [email or web address] Date: 12/25/2022 Prepared by: Temple Pacini  Exercises - Sit to Stand with Counter Support  - 1 x daily - 5 x weekly - 4 sets - 5 reps - Standing March with Counter Support  - 1 x daily - 5 x weekly - 2 sets - 10 reps GOALS:  SHORT TERM GOALS: Target date: 02/05/2023   Patient will be independent in home exercise program to improve strength/mobility for better functional independence with ADLs. Baseline: initiated Goal status: IN PROGRESS   LONG TERM GOALS: Target date: 03/19/2023   Patient will increase FOTO score to equal to or greater than 51  to demonstrate statistically significant improvement in mobility  and quality of life.  Baseline: deferred due to system error; 01/06/23: 42; 10/7: 57 Goal status: MET  2.  Patient (< 84 years old) will complete five times sit to stand test in < 10 seconds indicating increased LE strength and improved balance. Baseline: 21.7 sec; 02/03/23: 21 seconds hands-free (improved eccentric control) Goal status: IN PROGRESS  3.  Patient will increase Berg Balance score by > 6 points to demonstrate decreased fall risk during functional activities Baseline: 32; 02/03/23 36 (most difficulty with SLB, tandem stance)  Goal status: IN PROGRESS  4.  Patient will increase 10 meter walk test to >1.26m/s as to improve gait speed for  better community ambulation and to reduce fall risk. Baseline: 0.54 m/s; 02/03/23: 0.59 m/s  Goal status: IN PROGRESS  5.  Patient will increase six minute walk test distance to at least 1000 ft for progression to community ambulator and improved gait ability Baseline:  569 ft; 02/03/2023: 746 ft Goal status: IN PROGRESS    ASSESSMENT:  CLINICAL IMPRESSION: Continued obstacle course training. Pt without significant decrease in balance ambulating over compliant surface but did struggle with cone taps/SLB activities. Will continue to focus on this deficit. The pt will benefit from further skilled PT to address these impairments in order to increase functional mobility and decrease fall risk.  OBJECTIVE IMPAIRMENTS: Abnormal gait, decreased activity tolerance, decreased balance, decreased coordination, decreased endurance, decreased mobility, difficulty walking, decreased strength, impaired sensation, impaired tone, improper body mechanics, postural dysfunction, and pain.   ACTIVITY LIMITATIONS: carrying, lifting, standing, squatting, stairs, transfers, continence, and locomotion level  PARTICIPATION LIMITATIONS: cleaning, laundry, shopping, community activity, and yard work  PERSONAL FACTORS: Fitness, Time since onset of injury/illness/exacerbation, and 1-2 comorbidities: PMH consists of spastic ataxia, diabetes,hx of R shoulder bursitis per pt report  are also affecting patient's functional outcome.   REHAB POTENTIAL: Good  CLINICAL DECISION MAKING: Evolving/moderate complexity  EVALUATION COMPLEXITY: Moderate  PLAN:  PT FREQUENCY: 1-2x/week  PT DURATION: 12 weeks  PLANNED INTERVENTIONS: Therapeutic exercises, Therapeutic activity, Neuromuscular re-education, Balance training, Gait training, Patient/Family education, Danny Cooper Care, Joint mobilization, Stair training, Vestibular training, Canalith repositioning, Visual/preceptual remediation/compensation, Orthotic/Fit training, DME  instructions, Dry Needling, Electrical stimulation, Spinal mobilization, Cryotherapy, Moist heat, Taping, Ultrasound, Manual therapy, and Re-evaluation  PLAN FOR NEXT SESSION: strengthening, gait, static balance, uneven surface balance interventions   Baird Kay, PT 03/13/2023, 12:50 PM  12:50 PM, 03/13/23  Physical Therapist - [facility]  Outpatient Physical Therapy- Main Campus [phone]

## 2023-03-17 ENCOUNTER — Ambulatory Visit: Payer: 59

## 2023-03-18 ENCOUNTER — Telehealth: Payer: Self-pay

## 2023-03-18 ENCOUNTER — Ambulatory Visit: Payer: 59

## 2023-03-18 NOTE — Telephone Encounter (Signed)
PT contacted pt via secure phone line due to missed visit today. PT left VM notifying pt of next visit date/time.  Temple Pacini PT, DPT

## 2023-03-19 ENCOUNTER — Ambulatory Visit: Payer: 59

## 2023-03-19 DIAGNOSIS — F331 Major depressive disorder, recurrent, moderate: Secondary | ICD-10-CM | POA: Diagnosis not present

## 2023-03-24 ENCOUNTER — Ambulatory Visit: Payer: 59

## 2023-03-25 ENCOUNTER — Ambulatory Visit: Payer: 59

## 2023-03-25 DIAGNOSIS — M6281 Muscle weakness (generalized): Secondary | ICD-10-CM

## 2023-03-25 DIAGNOSIS — R2681 Unsteadiness on feet: Secondary | ICD-10-CM | POA: Diagnosis not present

## 2023-03-25 DIAGNOSIS — R262 Difficulty in walking, not elsewhere classified: Secondary | ICD-10-CM | POA: Diagnosis not present

## 2023-03-25 DIAGNOSIS — G114 Hereditary spastic paraplegia: Secondary | ICD-10-CM

## 2023-03-25 DIAGNOSIS — R278 Other lack of coordination: Secondary | ICD-10-CM | POA: Diagnosis not present

## 2023-03-25 NOTE — Therapy (Signed)
OUTPATIENT OCCUPATIONAL THERAPY NEURO EVALUATION  Patient Name: Danny Cooper MRN: [id number] DOB:1980/10/04, 42 y.o., male Today's Date: 03/25/2023  PCP: [facility] REFERRING PROVIDER: Dr. Cristopher Peru  END OF SESSION:  OT End of Session - 03/25/23 1915     Visit Number 1    Number of Visits 24    Date for OT Re-Evaluation 06/17/23    Progress Note Due on Visit 10    OT Start Time 1405    OT Stop Time 1445    OT Time Calculation (min) 40 min    Activity Tolerance Patient tolerated treatment well    Behavior During Therapy WFL for tasks assessed/performed            Past Medical History:  Diagnosis Date   Diabetes mellitus without complication (HCC)    Spastic ataxia, hereditary (HCC)    Past Surgical History:  Procedure Laterality Date   CARDIAC SURGERY     age 84   There are no problems to display for this patient.  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  Pt verbalized desire to work on his coordination and strength. Pt accompanied by: self  PERTINENT HISTORY:  Pt reports his physical decline started when Covid hit, and at this time, pt was dx with hereditary spastic paraplegia.  Per medical record, pt was recently in ED 12/07/22 where he reports he was diagnosed with pneumonia.  Pt verbalizes concern with weakness and worsening FMC, and wants to work to maximize his indep.   PMH consists of spastic ataxia, diabetes, seen ED earlier this month with concerns of weakness and lightheadedness where he was found to have pneumonia, hx of R shoulder bursitis per pt  report.  PRECAUTIONS: Fall  WEIGHT BEARING RESTRICTIONS: No  PAIN:  Are you having pain? No, occasional LBP  FALLS: Has patient fallen in last 6 months? Yes. 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  Pt does drive.  PATIENT GOALS: "Improve coordination and be stronger in my abilities."  OBJECTIVE:  Note: Objective measures were completed at Evaluation unless otherwise noted.  HAND DOMINANCE: Right  ADLs: Overall ADLs: spouse assists on occasion with clothing fasteners, otherwise pt manages basic ADLs with extra time-modified indep, increased effort Transfers/ambulation related to ADLs: occasional help out of bed (pt verbalizes wanting a bed rail) Eating: able to cut food using standard utensils Grooming: indep UB Dressing: pt reports being able to manage button up shirts LB Dressing: difficulty managing buttons on pants; wears slip on shoes d/t difficulty pulling shoes on and sometimes difficulty bending down.  Pt reports he has to lift legs up to bed to don socks and to thread  legs through pants  Toileting: Difficulty with clothing fasteners; uses a toilet wand for hygiene Bathing: Takes shower standing up, but this causes low back pain; hx of a fall in shower but "it's been a long time."  Pt does verbalize desire for a shower chair Tub Shower transfers: modified indep with tub/shower combo, but hx of falls Equipment: suction grab bars; pt would like a shower seat   IADLs: Shopping:  spouse has started to take over shopping d/t it taking pt too long  Light housekeeping: able to push a vacuum with 1 hand on walker  Meal Prep: spouse mostly manages; pt can use the microwave, modified indep with cold/light meal prep Community mobility: stand up walker and pt does drive Medication management: pt sets up his own pills in a pill Database administrator management: spouse primarily manages  Handwriting: 100% legible  MOBILITY STATUS: Hx of falls  POSTURE COMMENTS:  rounded shoulders  ACTIVITY TOLERANCE: Activity tolerance: TBD within functional contexts  FUNCTIONAL OUTCOME MEASURES: FOTO: 40; predicted 54  UPPER EXTREMITY ROM:  BUEs WFL  UPPER EXTREMITY MMT:   BUEs grossly 4/5  HAND FUNCTION: Mean grip strength for male age 85-44 yrs in lbs: R mean 116.8 (low 84 - high 165) L mean 112.8 (low 73 - high 157)  Grip strength: Right: 62 lbs; Left: 62 lbs, Lateral pinch: Right: 17 lbs, Left: 17 lbs, and 3 point pinch: Right: 21 lbs, Left: 12 lbs  COORDINATION: 9 Hole Peg test: Right: 60 sec; Left: 61 sec  SENSATION: Pt verbalizes occasional tingling in R/L thumbs  EDEMA: No visible edema in BUEs  MUSCLE TONE: LE spasticity, BUEs more ataxic than spastic  COGNITION: Overall cognitive status: Within functional limits for tasks assessed  VISION: Subjective report: wears glasses; pt verbalizes likeliness of needing a change in his Rx glasses  VISION ASSESSMENT: Not tested; time constraints.  Will test in upcoming sessions as needed or within functional contexts   PERCEPTION: Not tested  PRAXIS: Impaired: Motor planning, mild-moderate ataxia in BUEs  OBSERVATIONS:  Pt very pleasant and verbalizes desire to maximize indep.  Pt appeared almost tearful when discussing his difficulty managing his clothing fasteners at a previous PT session, and appears very motivated to work towards goals in [place].  TODAY'S TREATMENT:                                                                                                                               DATE: 03/25/23: Evaluation completed   PATIENT EDUCATION: Education details: OT role, goals, poc Person educated: Patient Education method: Explanation Education comprehension: verbalized understanding  HOME EXERCISE PROGRAM: To be initiated within upcoming sessions  GOALS: Goals reviewed with patient? Yes  SHORT TERM GOALS: Target date: 05/06/23  Pt will be indep to perform HEP for improving bilat hand strength and coordination for daily tasks. Baseline: Eval: HEP not yet initiated Goal status: INITIAL  2.  Pt will identify and implement 2-3  activity modifications and/or pieces of AE in order to reduce fall risk with daily tasks. Baseline: Eval: Fall prevention educ not yet initiated Goal status: INITIAL  LONG TERM GOALS: Target date: 06/17/23  Pt will increase FOTO score to 54 or better to indicate improvement in self perceived performance with assessment specific daily tasks. Baseline: Eval: 40 Goal status: INITIAL  2.  Pt will increase bilat grip strength by 10 or more lbs in order to improve ability to hold and transport heavy grocery bags. Baseline: Eval: Bilat grip strength 62 lbs (below mean of male of same age range; see above) Goal status: INITIAL  3.  Pt will improve bilat FMC/dexterity skills to better manage clothing fasteners as demonstrated by 10 sec or more improvement on 9 hole peg test bilaterally.  Baseline: Eval: R: 60 sec, L 61 sec; difficulty managing buttons on pants  Goal status: INITIAL  4.  Pt will improve bilat GMC in order to improve accuracy when reaching to put dishes away. Baseline: Eval: mild-moderate ataxia in BUE. Goal status: INITIAL  ASSESSMENT:  CLINICAL IMPRESSION: Patient is a 42 y.o. male who was seen today for occupational therapy evaluation for functional decline related to BUE muscle weakness and hx of hereditary spastic paraplegia and spastic ataxia.  Pt  verbalizes concern after struggling more with his ability to manage clothing fasteners.  Bed mobility and standing to take a shower are also becoming more difficult.  Pt is indep at baseline, and reported physical decline during Covid, which is when he was diagnosed with the hereditary spastic paraplegia.  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  Pt presents with bilat hand weakness and FMC/GMC impairments bilaterally, all impacting efficiency with daily tasks.  Pt also has hx of falls and ataxic gait.  Pt will benefit from skilled OT in order to provide educ on activity modification and AE in order to ease transfers and reduce fall risk, while providing HEP, therapeutic exercises, NMR, and therapeutic activities to improve BUE strength and coordination for daily tasks.  Pt in agreement with plan.   PERFORMANCE DEFICITS: in functional skills including ADLs, IADLs, coordination, dexterity, sensation, tone, strength, pain, Fine motor control, Gross motor control, mobility, balance, body mechanics, endurance, continence, decreased knowledge of use of DME, and UE functional use, cognitive skills including emotional, and psychosocial skills including coping strategies, environmental adaptation, habits, and routines and behaviors.   IMPAIRMENTS: are limiting patient from ADLs, IADLs, work, leisure, and social participation.   CO-MORBIDITIES: may have co-morbidities  that affects occupational performance. Patient will benefit from skilled OT to address above impairments and improve overall function.  MODIFICATION OR ASSISTANCE TO COMPLETE EVALUATION: No modification of tasks or assist necessary to complete an evaluation.  OT OCCUPATIONAL PROFILE AND HISTORY: Problem focused assessment:  Including review of records relating to presenting problem.  CLINICAL DECISION MAKING: Moderate - several treatment options, min-mod task modification necessary  REHAB POTENTIAL: Good  EVALUATION COMPLEXITY: Moderate    PLAN:  OT FREQUENCY: 1-2x/week  OT DURATION: 12 weeks  PLANNED INTERVENTIONS: 97168 OT Re-evaluation, 97535 self care/ADL training, 82956 therapeutic exercise, 97530 therapeutic activity, 97112 neuromuscular re-education, 97140 manual therapy, 97116 gait training, 21308 moist heat, 97010 cryotherapy, passive range of motion, balance training, functional mobility training, psychosocial skills training, energy conservation,  coping strategies training, patient/family education, and DME and/or AE instructions  RECOMMENDED OTHER SERVICES:   CONSULTED AND AGREED WITH PLAN OF CARE: Patient  PLAN FOR NEXT SESSION: see above  Danelle Earthly, MS, OTR/L   Otis Dials, OT 03/25/2023, 7:21 PM

## 2023-03-26 ENCOUNTER — Ambulatory Visit: Payer: 59

## 2023-03-26 NOTE — Addendum Note (Signed)
Addended by: Otis Dials on: 03/26/2023 12:22 PM   Modules accepted: Orders

## 2023-03-31 ENCOUNTER — Ambulatory Visit: Payer: 59

## 2023-04-01 ENCOUNTER — Telehealth: Payer: Self-pay

## 2023-04-01 ENCOUNTER — Ambulatory Visit: Payer: 59 | Attending: Neurology

## 2023-04-01 DIAGNOSIS — G114 Hereditary spastic paraplegia: Secondary | ICD-10-CM | POA: Insufficient documentation

## 2023-04-01 DIAGNOSIS — M6281 Muscle weakness (generalized): Secondary | ICD-10-CM | POA: Insufficient documentation

## 2023-04-01 DIAGNOSIS — R262 Difficulty in walking, not elsewhere classified: Secondary | ICD-10-CM | POA: Insufficient documentation

## 2023-04-01 DIAGNOSIS — R278 Other lack of coordination: Secondary | ICD-10-CM | POA: Insufficient documentation

## 2023-04-01 DIAGNOSIS — R2681 Unsteadiness on feet: Secondary | ICD-10-CM | POA: Insufficient documentation

## 2023-04-01 NOTE — Therapy (Incomplete)
OUTPATIENT PHYSICAL THERAPY TREATMENT   Patient Name: Danny Cooper MRN: [id number] DOB:1981-03-17, 42 y.o., male Today's Date: 04/01/2023   PCP: [facility]  REFERRING PROVIDER:   Lonell Face, MD    END OF SESSION:        Past Medical History:  Diagnosis Date   Diabetes mellitus without complication (HCC)    Spastic ataxia, hereditary (HCC)    Past Surgical History:  Procedure Laterality Date   CARDIAC SURGERY     age 79   There are no problems to display for this patient.   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  Rationale for Evaluation and Treatment: Rehabilitation  SUBJECTIVE:                                                                                                                                                                                             SUBJECTIVE STATEMENT: Pt reports no pain. He reports 1 fall about a week ago. He states it wasn't a complete fall and that he caught himself on his coffee table before making it to the floor. He reports no injury. Exercising has been going OK.   Pt accompanied by: self  PERTINENT HISTORY:   The pt is a pleasant 42 yo male with hereditary spastic paraplegia ambulating with upright walker referred for PT eval. Pt would like to improve his strength, mobility, balance and gait. He was previously seen in clinic years ago following diagnosis. Pt reports he returns now due to decline in his mobility that started about "a year to a year and a half ago." Pt uses a cane for short distances and uses his upright walker for longer distances. Pt has had 3-4 falls (maybe more) in the last six months. He reports no serious injury with falls. Falls have occurred with standing up from sitting and from tripping over his feet.  Pt reports decreased endurance, where he can ambulate <7 minutes before needing to rest. He can stand  with assistance for maybe 10-15 minutes. Pt was recently in ED 12/07/22 where he reports he was diagnoses with pneumonia. PMH consists of spastic ataxia, diabetes, seen ED earlier this month with concerns of weakness and lightheadedness where he was found to have pneumonia, hx of R shoulder bursitis per pt report  PAIN:  Are you having pain? No PRECAUTIONS: Fall  RED FLAGS: Bowel or bladder incontinence: Yes: reports urinary incontinence with progression of his condition    WEIGHT BEARING RESTRICTIONS: No  FALLS: Has patient fallen in last 6 months? Yes.  Number of falls 3-4  PATIENT GOALS: improve mobility, strength, balance  OBJECTIVE: taken at eval unless specified otherwise    TODAY'S TREATMENT:                                                                                                                              DATE: 04/01/23  Gait belt donned throughout for pt safety  NMR:     strengthening, gait, static balance, uneven surface balance interventions   Cone taps with BUE support and 5# AW each LE 2x10      TE: 5# AW donned:  LAQ 12x each LE  STS 10x LAQ 12x each LE  STS 10x  Standing hamstring curl 3x10 each LE  STS 6x      PATIENT EDUCATION: Pt educated throughout session about proper posture and technique with exercises. Improved exercise technique, movement at target joints, use of target muscles after min to mod verbal, visual, tactile cues.  Marland Kitchen   HOME EXERCISE PROGRAM: 12/31/22: Access Code: [email or web address] URL: [email or web address] Date: 12/31/2022 Prepared by: Temple Pacini  Exercises - Standing Tandem Balance with Counter Support  - 2 x daily - 7 x weekly - 1 sets - 2 reps - 30 seconds hold - Romberg Stance  - 2 x daily - 7 x weekly - 1 sets - 2 reps - 30 seconds hold - written instruction to perform at support surface with UE support  Access Code: [email or web address] URL: [email or web address] Date: 12/25/2022 Prepared by: Temple Pacini  Exercises - Sit to Stand with Counter Support  - 1 x daily - 5 x weekly - 4 sets - 5 reps - Standing March with Counter Support  - 1 x daily - 5 x weekly - 2 sets - 10 reps GOALS:  SHORT TERM GOALS: Target date: 02/05/2023   Patient will be independent in home exercise program to improve strength/mobility for better functional independence with ADLs. Baseline: initiated Goal status: IN PROGRESS   LONG TERM GOALS: Target date: 03/19/2023   Patient will increase FOTO score to equal to or greater than 51  to demonstrate statistically significant improvement in mobility and quality of life.  Baseline: deferred due to system error; 01/06/23: 42; 10/7: 57 Goal status: MET  2.  Patient (< 78 years old) will complete five times sit to stand test in < 10 seconds indicating increased LE strength and improved balance. Baseline: 21.7 sec; 02/03/23: 21 seconds hands-free (improved eccentric control) Goal status: IN PROGRESS  3.  Patient will increase Berg Balance score by > 6 points to demonstrate decreased fall risk during functional activities Baseline: 32; 02/03/23 36 (most difficulty with SLB, tandem stance)  Goal status: IN PROGRESS  4.  Patient will increase 10 meter walk test to >1.52m/s as to improve gait speed for better community ambulation and to reduce fall risk. Baseline: 0.54 m/s; 02/03/23: 0.59 m/s  Goal status: IN PROGRESS  5.  Patient will increase six minute walk test  distance to at least 1000 ft for progression to community ambulator and improved gait ability Baseline:  569 ft; 02/03/2023: 746 ft Goal status: IN PROGRESS    ASSESSMENT:  CLINICAL IMPRESSION: Continued obstacle course training. Pt without significant decrease in balance ambulating over compliant surface but did struggle with cone taps/SLB activities. Will continue to focus on this deficit. The pt will benefit from further skilled PT to address these impairments in order to increase functional mobility and  decrease fall risk.  OBJECTIVE IMPAIRMENTS: Abnormal gait, decreased activity tolerance, decreased balance, decreased coordination, decreased endurance, decreased mobility, difficulty walking, decreased strength, impaired sensation, impaired tone, improper body mechanics, postural dysfunction, and pain.   ACTIVITY LIMITATIONS: carrying, lifting, standing, squatting, stairs, transfers, continence, and locomotion level  PARTICIPATION LIMITATIONS: cleaning, laundry, shopping, community activity, and yard work  PERSONAL FACTORS: Fitness, Time since onset of injury/illness/exacerbation, and 1-2 comorbidities: PMH consists of spastic ataxia, diabetes,hx of R shoulder bursitis per pt report  are also affecting patient's functional outcome.   REHAB POTENTIAL: Good  CLINICAL DECISION MAKING: Evolving/moderate complexity  EVALUATION COMPLEXITY: Moderate  PLAN:  PT FREQUENCY: 1-2x/week  PT DURATION: 12 weeks  PLANNED INTERVENTIONS: Therapeutic exercises, Therapeutic activity, Neuromuscular re-education, Balance training, Gait training, Patient/Family education, Self Care, Joint mobilization, Stair training, Vestibular training, Canalith repositioning, Visual/preceptual remediation/compensation, Orthotic/Fit training, DME instructions, Dry Needling, Electrical stimulation, Spinal mobilization, Cryotherapy, Moist heat, Taping, Ultrasound, Manual therapy, and Re-evaluation  PLAN FOR NEXT SESSION: strengthening, gait, static balance, uneven surface balance interventions   Baird Kay, PT 04/01/2023, 2:00 PM  2:00 PM, 04/01/23  Physical Therapist - [facility]  Outpatient Physical Therapy- Main Campus [phone]

## 2023-04-01 NOTE — Telephone Encounter (Signed)
PT reached out to pt via secure phone line due to missed visit/no-show today. PT left VM with information regarding next visit date/time and clinic contact info.  Temple Pacini PT, DPT

## 2023-04-02 ENCOUNTER — Ambulatory Visit: Payer: 59

## 2023-04-07 ENCOUNTER — Ambulatory Visit: Payer: 59

## 2023-04-08 ENCOUNTER — Ambulatory Visit: Payer: 59

## 2023-04-08 DIAGNOSIS — M6281 Muscle weakness (generalized): Secondary | ICD-10-CM

## 2023-04-08 DIAGNOSIS — G114 Hereditary spastic paraplegia: Secondary | ICD-10-CM

## 2023-04-08 DIAGNOSIS — R262 Difficulty in walking, not elsewhere classified: Secondary | ICD-10-CM | POA: Diagnosis not present

## 2023-04-08 DIAGNOSIS — R2681 Unsteadiness on feet: Secondary | ICD-10-CM | POA: Diagnosis not present

## 2023-04-08 DIAGNOSIS — R278 Other lack of coordination: Secondary | ICD-10-CM | POA: Diagnosis not present

## 2023-04-08 NOTE — Therapy (Signed)
OUTPATIENT OCCUPATIONAL THERAPY NEURO TREATMENT  Patient Name: Danny Cooper MRN: [id number] DOB:Jul 26, 1980, 42 y.o., male Today's Date: 04/13/2023  PCP: [facility] REFERRING PROVIDER: Dr. Cristopher Peru  END OF SESSION:  OT End of Session - 04/13/23 1244     Visit Number 2    Number of Visits 24    Date for OT Re-Evaluation 06/17/23    Progress Note Due on Visit 10    OT Start Time 1445    OT Stop Time 1530    OT Time Calculation (min) 45 min    Activity Tolerance Patient tolerated treatment well    Behavior During Therapy WFL for tasks assessed/performed            Past Medical History:  Diagnosis Date   Diabetes mellitus without complication (HCC)    Spastic ataxia, hereditary (HCC)    Past Surgical History:  Procedure Laterality Date   CARDIAC SURGERY     age 56   There are no active problems to display for this patient.  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  Pt accompanied by: self  PERTINENT HISTORY:  Pt reports his physical decline started when Covid hit, and at this time, pt was dx with hereditary spastic paraplegia.  Per medical record, pt was recently in ED 12/07/22 where he reports he was diagnosed with pneumonia.  Pt verbalizes concern with weakness and worsening FMC, and wants to work to maximize his indep.   PMH consists of spastic ataxia, diabetes, seen ED earlier this month with concerns of weakness and lightheadedness where he was found to have pneumonia, hx of R shoulder bursitis per pt report.  PRECAUTIONS: Fall  WEIGHT BEARING RESTRICTIONS: No  PAIN:  Are you having pain?  No, occasional LBP  FALLS: Has patient fallen in last 6 months? Yes. 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  Pt does drive.  PATIENT GOALS: "Improve coordination and be stronger in my abilities."  OBJECTIVE:  Note: Objective measures were completed at Evaluation unless otherwise noted.  HAND DOMINANCE: Right  ADLs: Overall ADLs: spouse assists on occasion with clothing fasteners, otherwise pt manages basic ADLs with extra time-modified indep, increased effort Transfers/ambulation related to ADLs: occasional help out of bed (pt verbalizes wanting a bed rail) Eating: able to cut food using standard utensils Grooming: indep UB Dressing: pt reports being able to manage button up shirts LB Dressing: difficulty managing buttons on pants; wears slip on shoes d/t difficulty pulling shoes on and sometimes difficulty bending down.  Pt reports he has to lift legs up to bed to don socks and to thread legs through pants  Toileting: Difficulty with clothing fasteners; uses a toilet  wand for hygiene Bathing: Takes shower standing up, but this causes low back pain; hx of a fall in shower but "it's been a long time."  Pt does verbalize desire for a shower chair Tub Shower transfers: modified indep with tub/shower combo, but hx of falls Equipment: suction grab bars; pt would like a shower seat   IADLs: Shopping: spouse has started to take over shopping d/t it taking pt too long  Light housekeeping: able to  push a vacuum with 1 hand on walker  Meal Prep: spouse mostly manages; pt can use the microwave, modified indep with cold/light meal prep Community mobility: stand up walker and pt does drive Medication management: pt sets up his own pills in a pill Database administrator management: spouse primarily manages  Handwriting: 100% legible  MOBILITY STATUS: Hx of falls  POSTURE COMMENTS:  rounded shoulders  ACTIVITY TOLERANCE: Activity tolerance: TBD within functional contexts  FUNCTIONAL OUTCOME MEASURES: FOTO: 40; predicted 54  UPPER EXTREMITY ROM:  BUEs WFL  UPPER EXTREMITY MMT:   BUEs grossly 4/5  HAND FUNCTION: Mean grip strength for male age 71-44 yrs in lbs: R mean 116.8 (low 84 - high 165) L mean 112.8 (low 73 - high 157)  Grip strength: Right: 62 lbs; Left: 62 lbs, Lateral pinch: Right: 17 lbs, Left: 17 lbs, and 3 point pinch: Right: 21 lbs, Left: 12 lbs  COORDINATION: 9 Hole Peg test: Right: 60 sec; Left: 61 sec  SENSATION: Pt verbalizes occasional tingling in R/L thumbs  EDEMA: No visible edema in BUEs  MUSCLE TONE: LE spasticity, BUEs more ataxic than spastic  COGNITION: Overall cognitive status: Within functional limits for tasks assessed  VISION: Subjective report: wears glasses; pt verbalizes likeliness of needing a change in his Rx glasses  VISION ASSESSMENT: Not tested; time constraints.  Will test in upcoming sessions as needed or within functional contexts   PERCEPTION: Not tested  PRAXIS: Impaired: Motor planning, mild-moderate ataxia in BUEs  OBSERVATIONS:  Pt very pleasant and verbalizes desire to maximize indep.  Pt appeared almost tearful when discussing his difficulty managing his clothing fasteners at a previous PT session, and appears very motivated to work towards goals in [place].  TODAY'S TREATMENT:                                                                                                                              DATE:  04/08/23: Therapeutic Exercise: Issued green theraputty and instructed pt in strengthening and coordination exercises for R/L hands, including gross grasping, lateral/2 point/3 point pinching, digit abd/add, and digging coins out of putty.  Able to return demo with intermittent vc for technique to improve quality of movement.  Encouraged completion 5-10 min, 1-2x per day.  Visual handout issued for carry over at home.  Neuro re-ed: Facilitated R/L hand FMC/dexterity skills working to place grooved pegs into pegboard.  Pt was given vc to increase challenge by turning peg within fingertips and avoid stabilizing  peg on board while repositioning peg to fit peg in the groove.  Practiced storing 1 peg in hand while placing another into pegboard, then storing up to a row of pegs in palm before discarding them into dish.   Issued FMC/functional hand exercises HEP with review of listed items to challenge coordination/dexterity skills with items commonly found in the home.   PATIENT EDUCATION: Education details: Putty exercises Person educated: Patient Education method: Explanation Education comprehension: verbalized understanding  HOME EXERCISE PROGRAM: Green theraputty exercises, FMC/functional hand exercises  GOALS: Goals reviewed with patient? Yes  SHORT TERM GOALS: Target date: 05/06/23  Pt will be indep to perform HEP for improving bilat hand strength and coordination for daily tasks. Baseline: Eval: HEP not yet initiated Goal status: INITIAL  2.  Pt will identify and implement 2-3 activity modifications and/or pieces of AE in order to reduce fall risk with daily tasks. Baseline: Eval: Fall prevention educ not yet initiated Goal status: INITIAL  LONG TERM GOALS: Target date: 06/17/23  Pt will increase FOTO score to 54 or better to indicate improvement in self perceived performance with assessment specific daily tasks. Baseline: Eval: 40 Goal status: INITIAL  2.  Pt will increase bilat  grip strength by 10 or more lbs in order to improve ability to hold and transport heavy grocery bags. Baseline: Eval: Bilat grip strength 62 lbs (below mean of male of same age range; see above) Goal status: INITIAL  3.  Pt will improve bilat FMC/dexterity skills to better manage clothing fasteners as demonstrated by 10 sec or more improvement on 9 hole peg test bilaterally.  Baseline: Eval: R: 60 sec, L 61 sec; difficulty managing buttons on pants  Goal status: INITIAL  4.  Pt will improve bilat GMC in order to improve accuracy when reaching to put dishes away. Baseline: Eval: mild-moderate ataxia in BUE. Goal status: INITIAL  ASSESSMENT:  CLINICAL IMPRESSION: Good tolerance to all therapeutic exercises and neuro re-ed activities this date; handouts issued for carryover of hand strengthening and FMC/dexterity exercises to complete in the home.  Pt was challenged by manipulating more than 1 peg at a time in hand, specifically with storage of multiple in palm and when working to reposition a peg within fingertips when working with grooved pegs.  Pt will continue to benefit from skilled OT in order to provide educ on activity modification and AE in order to ease transfers and reduce fall risk, while providing HEP, therapeutic exercises, NMR, and therapeutic activities to improve BUE strength and coordination for daily tasks.  Pt in agreement with plan.   PERFORMANCE DEFICITS: in functional skills including ADLs, IADLs, coordination, dexterity, sensation, tone, strength, pain, Fine motor control, Gross motor control, mobility, balance, body mechanics, endurance, continence, decreased knowledge of use of DME, and UE functional use, cognitive skills including emotional, and psychosocial skills including coping strategies, environmental adaptation, habits, and routines and behaviors.   IMPAIRMENTS: are limiting patient from ADLs, IADLs, work, leisure, and social participation.   CO-MORBIDITIES: may  have co-morbidities  that affects occupational performance. Patient will benefit from skilled OT to address above impairments and improve overall function.  MODIFICATION OR ASSISTANCE TO COMPLETE EVALUATION: No modification of tasks or assist necessary to complete an evaluation.  OT OCCUPATIONAL PROFILE AND HISTORY: Problem focused assessment: Including review of records relating to presenting problem.  CLINICAL DECISION MAKING: Moderate - several treatment options, min-mod task modification necessary  REHAB POTENTIAL: Good  EVALUATION COMPLEXITY: Moderate    PLAN:  OT FREQUENCY:  1-2x/week  OT DURATION: 12 weeks  PLANNED INTERVENTIONS: 97168 OT Re-evaluation, 97535 self care/ADL training, 16109 therapeutic exercise, 97530 therapeutic activity, 97112 neuromuscular re-education, 97140 manual therapy, 97116 gait training, 60454 moist heat, 97010 cryotherapy, passive range of motion, balance training, functional mobility training, psychosocial skills training, energy conservation, coping strategies training, patient/family education, and DME and/or AE instructions  RECOMMENDED OTHER SERVICES:   CONSULTED AND AGREED WITH PLAN OF CARE: Patient  PLAN FOR NEXT SESSION: see above  Danelle Earthly, MS, OTR/L  Otis Dials, OT 04/13/2023, 12:46 PM

## 2023-04-09 ENCOUNTER — Ambulatory Visit: Payer: 59

## 2023-04-14 ENCOUNTER — Ambulatory Visit: Payer: 59

## 2023-04-15 ENCOUNTER — Ambulatory Visit: Payer: 59

## 2023-04-16 ENCOUNTER — Ambulatory Visit: Payer: 59

## 2023-04-16 DIAGNOSIS — M6281 Muscle weakness (generalized): Secondary | ICD-10-CM | POA: Diagnosis not present

## 2023-04-16 DIAGNOSIS — R262 Difficulty in walking, not elsewhere classified: Secondary | ICD-10-CM | POA: Diagnosis not present

## 2023-04-16 DIAGNOSIS — G114 Hereditary spastic paraplegia: Secondary | ICD-10-CM | POA: Diagnosis not present

## 2023-04-16 DIAGNOSIS — R2681 Unsteadiness on feet: Secondary | ICD-10-CM

## 2023-04-16 DIAGNOSIS — R278 Other lack of coordination: Secondary | ICD-10-CM

## 2023-04-16 NOTE — Therapy (Signed)
OUTPATIENT PHYSICAL THERAPY TREATMENT/RECERT   Patient Name: Danny Cooper MRN: [id number] DOB:12-20-1980, 42 y.o., male Today's Date: 04/16/2023   PCP: [facility]  REFERRING PROVIDER:   Lonell Face, MD    END OF SESSION:  PT End of Session - 04/16/23 1620     Visit Number 15    Number of Visits 39    Date for PT Re-Evaluation 07/09/23    Authorization Type [employer]    Progress Note Due on Visit 10    PT Start Time 1620    PT Stop Time 1701    PT Time Calculation (min) 41 min    Equipment Utilized During Treatment Gait belt    Activity Tolerance Patient tolerated treatment well    Behavior During Therapy WFL for tasks assessed/performed                  Past Medical History:  Diagnosis Date   Diabetes mellitus without complication (HCC)    Spastic ataxia, hereditary (HCC)    Past Surgical History:  Procedure Laterality Date   CARDIAC SURGERY     age 70   There are no active problems to display for this patient.   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   Pt accompanied by: self  PERTINENT HISTORY:   The pt is a pleasant 42 yo male with hereditary spastic paraplegia ambulating with upright walker referred for PT eval. Pt would like to improve his strength, mobility, balance and gait. He was previously seen in clinic years ago following diagnosis. Pt reports he returns now due to decline in his  mobility that started about "a year to a year and a half ago." Pt uses a cane for short distances and uses his upright walker for longer distances. Pt has had 3-4 falls (maybe more) in the last six months. He reports no serious injury with falls. Falls have occurred with standing up from sitting and from tripping over his feet.  Pt reports decreased endurance, where he can ambulate <7 minutes before needing to rest. He can stand with assistance for  maybe 10-15 minutes. Pt was recently in ED 12/07/22 where he reports he was diagnoses with pneumonia. PMH consists of spastic ataxia, diabetes, seen ED earlier this month with concerns of weakness and lightheadedness where he was found to have pneumonia, hx of R shoulder bursitis per pt report  PAIN:  Are you having pain? No PRECAUTIONS: Fall  RED FLAGS: Bowel or bladder incontinence: Yes: reports urinary incontinence with progression of his condition    WEIGHT BEARING RESTRICTIONS: No  FALLS: Has patient fallen in last 6 months? Yes. Number of falls 3-4  PATIENT GOALS: improve mobility, strength, balance  OBJECTIVE: taken at eval unless specified otherwise    TODAY'S TREATMENT:                                                                                                                              DATE: 04/16/23  Gait belt donned throughout for pt safety  TA: Goal retesting completed today for recert. Please refer to goal section and assessment below for details   NMR: other interventions completed  Semi-tandem 2x30 sec each LE at support surface, intermittent UE support     PATIENT EDUCATION: Pt educated throughout session about proper posture and technique with exercises. Improved exercise technique, movement at target joints, use of target muscles after min to mod verbal, visual, tactile cues. Goals .   HOME EXERCISE PROGRAM: 12/31/22: Access Code: [email or web address] URL: [email or web address] Date: 12/31/2022 Prepared by:  Temple Pacini  Exercises - Standing Tandem Balance with Counter Support  - 2 x daily - 7 x weekly - 1 sets - 2 reps - 30 seconds hold - Romberg Stance  - 2 x daily - 7 x weekly - 1 sets - 2 reps - 30 seconds hold - written instruction to perform at support surface with UE support  Access Code: [email or web address] URL: [email or web address] Date: 12/25/2022 Prepared by: Temple Pacini  Exercises - Sit to Stand with Counter Support  - 1 x daily - 5 x weekly - 4 sets - 5 reps - Standing March with Counter Support  - 1 x daily - 5 x weekly - 2 sets - 10 reps GOALS:  SHORT TERM GOALS: Target date: 02/05/2023   Patient will be independent in home exercise program to improve strength/mobility for better functional independence with ADLs. Baseline: initiated Goal status: IN PROGRESS   LONG TERM GOALS: Target date: 03/19/2023   Patient will increase FOTO score to equal to or greater than 51  to demonstrate statistically significant improvement in mobility and quality of life.  Baseline: deferred due to system error; 01/06/23: 42; 10/7: 57 Goal status: MET  2.  Patient (< 70 years old) will complete five times sit to stand test in < 10 seconds indicating increased LE strength and improved balance. Baseline: 21.7 sec; 02/03/23: 21 seconds hands-free (improved eccentric control); 12/18: 18 sec hands-free Goal status: IN PROGRESS  3.  Patient will increase Berg Balance score by > 6 points  to demonstrate decreased fall risk during functional activities Baseline: 32; 02/03/23 36 (most difficulty with SLB, tandem stance) ; 04/16/23 38/56 Goal status: IN PROGRESS  4.  Patient will increase 10 meter walk test to >1.60m/s as to improve gait speed for better community ambulation and to reduce fall risk. Baseline: 0.54 m/s; 02/03/23: 0.59 m/s ; 12/18: 0.76 m/s with RW Goal status: IN PROGRESS  5.  Patient will increase six minute walk test distance to at least 1000 ft for progression to community  ambulator and improved gait ability Baseline:  569 ft; 02/03/2023: 746 ft; 12/18: 808 ft Goal status: IN PROGRESS    ASSESSMENT:  CLINICAL IMPRESSION: Goal reassessment completed for recert. Pt making gains AEB improving on all goals tested today, indicating gains with functional capacity/gait ability, gait speed, balance, LE strength and decreased fall risk. Patient's condition has the potential to improve in response to therapy. Maximum improvement is yet to be obtained. The anticipated improvement is attainable and reasonable in a generally predictable time.  The pt will benefit from further skilled PT to address these impairments in order to increase functional mobility and decrease fall risk.  OBJECTIVE IMPAIRMENTS: Abnormal gait, decreased activity tolerance, decreased balance, decreased coordination, decreased endurance, decreased mobility, difficulty walking, decreased strength, impaired sensation, impaired tone, improper body mechanics, postural dysfunction, and pain.   ACTIVITY LIMITATIONS: carrying, lifting, standing, squatting, stairs, transfers, continence, and locomotion level  PARTICIPATION LIMITATIONS: cleaning, laundry, shopping, community activity, and yard work  PERSONAL FACTORS: Fitness, Time since onset of injury/illness/exacerbation, and 1-2 comorbidities: PMH consists of spastic ataxia, diabetes,hx of R shoulder bursitis per pt report  are also affecting patient's functional outcome.   REHAB POTENTIAL: Good  CLINICAL DECISION MAKING: Evolving/moderate complexity  EVALUATION COMPLEXITY: Moderate  PLAN:  PT FREQUENCY: 1-2x/week  PT DURATION: 12 weeks  PLANNED INTERVENTIONS: Therapeutic exercises, Therapeutic activity, Neuromuscular re-education, Balance training, Gait training, Patient/Family education, Self Care, Joint mobilization, Stair training, Vestibular training, Canalith repositioning, Visual/preceptual remediation/compensation, Orthotic/Fit training, DME  instructions, Dry Needling, Electrical stimulation, Spinal mobilization, Cryotherapy, Moist heat, Taping, Ultrasound, Manual therapy, and Re-evaluation  PLAN FOR NEXT SESSION: strengthening, gait, static balance, uneven surface balance interventions, continue plan   Baird Kay, PT 04/16/2023, 5:18 PM  5:18 PM, 04/16/23  Physical Therapist - [facility]  Outpatient Physical Therapy- Main Campus [phone]

## 2023-04-22 ENCOUNTER — Ambulatory Visit: Payer: 59

## 2023-04-22 DIAGNOSIS — M6281 Muscle weakness (generalized): Secondary | ICD-10-CM

## 2023-04-22 DIAGNOSIS — G114 Hereditary spastic paraplegia: Secondary | ICD-10-CM

## 2023-04-22 DIAGNOSIS — R262 Difficulty in walking, not elsewhere classified: Secondary | ICD-10-CM | POA: Diagnosis not present

## 2023-04-22 DIAGNOSIS — R278 Other lack of coordination: Secondary | ICD-10-CM

## 2023-04-22 DIAGNOSIS — R2681 Unsteadiness on feet: Secondary | ICD-10-CM | POA: Diagnosis not present

## 2023-04-22 NOTE — Therapy (Signed)
OUTPATIENT OCCUPATIONAL THERAPY NEURO TREATMENT  Patient Name: Danny Cooper MRN: [id number] DOB:[birth year], 42 y.o., male Today's Date: 04/22/2023  PCP: [facility] REFERRING PROVIDER: Dr. Cristopher Peru  END OF SESSION:  OT End of Session - 04/22/23 0911     Visit Number 3    Number of Visits 24    Date for OT Re-Evaluation 06/17/23    Progress Note Due on Visit 10    OT Start Time 0850    OT Stop Time 0930    OT Time Calculation (min) 40 min    Activity Tolerance Patient tolerated treatment well    Behavior During Therapy WFL for tasks assessed/performed            Past Medical History:  Diagnosis Date   Diabetes mellitus without complication (HCC)    Spastic ataxia, hereditary (HCC)    Past Surgical History:  Procedure Laterality Date   CARDIAC SURGERY     age 61   There are no active problems to display for this patient.  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  OT encouraged pt make using his theraputty a part of his routine now that he's on break and recommended placing putty near a coffee table to encourage use while watching a tv show.    Pt accompanied by: self  PERTINENT HISTORY:  Pt reports his physical decline started when Covid hit, and at this time, pt was dx with hereditary spastic paraplegia.  Per medical record, pt was recently in ED 12/07/22 where he reports he was diagnosed with pneumonia.  Pt verbalizes concern with weakness and worsening FMC, and wants to work to maximize his indep.   PMH consists of spastic ataxia,  diabetes, seen ED earlier this month with concerns of weakness and lightheadedness where he was found to have pneumonia, hx of R shoulder bursitis per pt report.  PRECAUTIONS: Fall  WEIGHT BEARING RESTRICTIONS: No  PAIN:  Are you having pain? No, occasional LBP  FALLS: Has patient fallen in last 6 months? Yes. 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  Pt does drive.  PATIENT GOALS: "Improve coordination and be stronger in my abilities."  OBJECTIVE:  Note: Objective measures were completed at Evaluation unless otherwise noted.  HAND DOMINANCE: Right  ADLs: Overall ADLs: spouse assists on occasion with clothing fasteners, otherwise pt manages basic ADLs with extra time-modified indep, increased effort Transfers/ambulation related to ADLs: occasional help out of bed (pt verbalizes wanting a bed rail) Eating: able to cut food using standard utensils Grooming: indep UB Dressing: pt reports being able to manage button up shirts LB Dressing: difficulty managing buttons  on pants; wears slip on shoes d/t difficulty pulling shoes on and sometimes difficulty bending down.  Pt reports he has to lift legs up to bed to don socks and to thread legs through pants  Toileting: Difficulty with clothing fasteners; uses a toilet wand for hygiene Bathing: Takes shower standing up, but this causes low back pain; hx of a fall in shower but "it's been a long time."  Pt does verbalize desire for a shower chair Tub  Shower transfers: modified indep with tub/shower combo, but hx of falls Equipment: suction grab bars; pt would like a shower seat   IADLs: Shopping: spouse has started to take over shopping d/t it taking pt too long  Light housekeeping: able to push a vacuum with 1 hand on walker  Meal Prep: spouse mostly manages; pt can use the microwave, modified indep with cold/light meal prep Community mobility: stand up walker and pt does drive Medication management: pt sets up his own pills in a pill Database administrator management: spouse primarily manages  Handwriting: 100% legible  MOBILITY STATUS: Hx of falls  POSTURE COMMENTS:  rounded shoulders  ACTIVITY TOLERANCE: Activity tolerance: TBD within functional contexts  FUNCTIONAL OUTCOME MEASURES: FOTO: 40; predicted 54  UPPER EXTREMITY ROM:  BUEs WFL  UPPER EXTREMITY MMT:   BUEs grossly 4/5  HAND FUNCTION: Mean grip strength for male age 39-44 yrs in lbs: R mean 116.8 (low 84 - high 165) L mean 112.8 (low 73 - high 157)  Grip strength: Right: 62 lbs; Left: 62 lbs, Lateral pinch: Right: 17 lbs, Left: 17 lbs, and 3 point pinch: Right: 21 lbs, Left: 12 lbs  COORDINATION: 9 Hole Peg test: Right: 60 sec; Left: 61 sec  SENSATION: Pt verbalizes occasional tingling in R/L thumbs  EDEMA: No visible edema in BUEs  MUSCLE TONE: LE spasticity, BUEs more ataxic than spastic  COGNITION: Overall cognitive status: Within functional limits for tasks assessed  VISION: Subjective report: wears glasses; pt verbalizes likeliness of needing a change in his Rx glasses  VISION ASSESSMENT: Not tested; time constraints.  Will test in upcoming sessions as needed or within functional contexts   PERCEPTION: Not tested  PRAXIS: Impaired: Motor planning, mild-moderate ataxia in BUEs  OBSERVATIONS:  Pt very pleasant and verbalizes desire to maximize indep.  Pt appeared almost tearful when discussing his difficulty managing his clothing fasteners at  a previous PT session, and appears very motivated to work towards goals in [place].  TODAY'S TREATMENT:                                                                                                                              DATE: 04/22/23: Self Care: Education provided re: AE options for fall prevention in shower.  Advised on benefits of transfer tub bench vs shower chair, tub bar, and non-skid shower strips for tub floor. Advised on options to obtain if desired.  Demonstrated use of button hook with pt showing good  return demo.  Encouraged pt practice button hook on various items of clothing, including difficult buttons on pants.  Pt verbalized understanding.    Neuro re-ed: Facilitated R/L hand FMC/dexterity and GMC skills working on reaching, IT consultant, and translatory skills working to pick up washers 1 by 1 from magnetic dish, store 3 in hand, and then thread washers over a vertical dowel.  Pt then practiced removing washers from dowel in horizontal position, removing washers 1 by 1, storing 3 in hand, and moving washers from palm to fingertips to discard into magnetic dish 1 by 1.  PATIENT EDUCATION: Education details: Coordination exercises with coins Person educated: Patient Education method: Explanation Education comprehension: verbalized understanding  HOME EXERCISE PROGRAM: Green theraputty exercises, FMC/functional hand exercises  GOALS: Goals reviewed with patient? Yes  SHORT TERM GOALS: Target date: 05/06/23  Pt will be indep to perform HEP for improving bilat hand strength and coordination for daily tasks. Baseline: Eval: HEP not yet initiated Goal status: INITIAL  2.  Pt will identify and implement 2-3 activity modifications and/or pieces of AE in order to reduce fall risk with daily tasks. Baseline: Eval: Fall prevention educ not yet initiated Goal status: INITIAL  LONG TERM GOALS: Target date: 06/17/23  Pt will increase FOTO score to 54 or better to indicate  improvement in self perceived performance with assessment specific daily tasks. Baseline: Eval: 40 Goal status: INITIAL  2.  Pt will increase bilat grip strength by 10 or more lbs in order to improve ability to hold and transport heavy grocery bags. Baseline: Eval: Bilat grip strength 62 lbs (below mean of male of same age range; see above) Goal status: INITIAL  3.  Pt will improve bilat FMC/dexterity skills to better manage clothing fasteners as demonstrated by 10 sec or more improvement on 9 hole peg test bilaterally.  Baseline: Eval: R: 60 sec, L 61 sec; difficulty managing buttons on pants  Goal status: INITIAL  4.  Pt will improve bilat GMC in order to improve accuracy when reaching to put dishes away. Baseline: Eval: mild-moderate ataxia in BUE. Goal status: INITIAL  ASSESSMENT:  CLINICAL IMPRESSION: Pt receptive to education on AE for shower, and will plan to work with button hook at home on difficult buttons on the waist of his pants.  Pt continues to present with difficulty using translatory movements in either hand, and had frequent dropping when working with washers as noted above.  OT made recommendations for coin activities to simulate washer activity noted above to target translatory skills.  Pt will continue to benefit from skilled OT to target bilat FMC/GMC skills in order to improve efficiency with small item manipulation, to improve accuracy with functional reaching, and continued ADL/AE training in order to maximize indep with ADLs, and to reduce fall prevention in the home.   PERFORMANCE DEFICITS: in functional skills including ADLs, IADLs, coordination, dexterity, sensation, tone, strength, pain, Fine motor control, Gross motor control, mobility, balance, body mechanics, endurance, continence, decreased knowledge of use of DME, and UE functional use, cognitive skills including emotional, and psychosocial skills including coping strategies, environmental adaptation, habits,  and routines and behaviors.   IMPAIRMENTS: are limiting patient from ADLs, IADLs, work, leisure, and social participation.   CO-MORBIDITIES: may have co-morbidities  that affects occupational performance. Patient will benefit from skilled OT to address above impairments and improve overall function.  MODIFICATION OR ASSISTANCE TO COMPLETE EVALUATION: No modification of tasks or assist necessary to complete an evaluation.  OT OCCUPATIONAL PROFILE AND  HISTORY: Problem focused assessment: Including review of records relating to presenting problem.  CLINICAL DECISION MAKING: Moderate - several treatment options, min-mod task modification necessary  REHAB POTENTIAL: Good  EVALUATION COMPLEXITY: Moderate    PLAN:  OT FREQUENCY: 1-2x/week  OT DURATION: 12 weeks  PLANNED INTERVENTIONS: 97168 OT Re-evaluation, 97535 self care/ADL training, 40981 therapeutic exercise, 97530 therapeutic activity, 97112 neuromuscular re-education, 97140 manual therapy, 97116 gait training, 19147 moist heat, 97010 cryotherapy, passive range of motion, balance training, functional mobility training, psychosocial skills training, energy conservation, coping strategies training, patient/family education, and DME and/or AE instructions  RECOMMENDED OTHER SERVICES:   CONSULTED AND AGREED WITH PLAN OF CARE: Patient  PLAN FOR NEXT SESSION: see above  Danelle Earthly, MS, OTR/L  Otis Dials, OT 04/22/2023, 9:15 AM

## 2023-04-29 ENCOUNTER — Ambulatory Visit: Payer: 59

## 2023-04-29 DIAGNOSIS — R2681 Unsteadiness on feet: Secondary | ICD-10-CM

## 2023-04-29 DIAGNOSIS — R262 Difficulty in walking, not elsewhere classified: Secondary | ICD-10-CM | POA: Diagnosis not present

## 2023-04-29 DIAGNOSIS — G114 Hereditary spastic paraplegia: Secondary | ICD-10-CM | POA: Diagnosis not present

## 2023-04-29 DIAGNOSIS — R278 Other lack of coordination: Secondary | ICD-10-CM | POA: Diagnosis not present

## 2023-04-29 DIAGNOSIS — M6281 Muscle weakness (generalized): Secondary | ICD-10-CM | POA: Diagnosis not present

## 2023-04-29 DIAGNOSIS — G4733 Obstructive sleep apnea (adult) (pediatric): Secondary | ICD-10-CM | POA: Diagnosis not present

## 2023-04-29 NOTE — Therapy (Signed)
 OUTPATIENT PHYSICAL THERAPY TREATMENT   Patient Name: Danny Cooper MRN: [id number] DOB:Apr 29, 1981, 42 y.o., male Today's Date: 04/29/2023   PCP: [facility]  REFERRING PROVIDER:   Maree Jannett POUR, MD    END OF SESSION:  PT End of Session - 04/29/23 1322     Visit Number 16    Number of Visits 39    Date for PT Re-Evaluation 07/09/23    Authorization Type [employer]    Progress Note Due on Visit 10    PT Start Time 1323    PT Stop Time 1358    PT Time Calculation (min) 35 min    Equipment Utilized During Treatment Gait belt    Activity Tolerance Patient tolerated treatment well    Behavior During Therapy WFL for tasks assessed/performed                  Past Medical History:  Diagnosis Date   Diabetes mellitus without complication (HCC)    Spastic ataxia, hereditary (HCC)    Past Surgical History:  Procedure Laterality Date   CARDIAC SURGERY     age 72   There are no active problems to display for this patient.   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 Pt notices balance problems when he has to bend to pick something up. He reports a recent fall onto his knee when getting his walker out of his car. He reports he did not get injured. He was able to get up indep.   Pt accompanied by: self  PERTINENT HISTORY:   The pt is a pleasant 42 yo male with hereditary spastic paraplegia ambulating with upright walker referred for PT eval. Pt would like to improve his strength, mobility, balance and gait. He was  previously seen in clinic years ago following diagnosis. Pt reports he returns now due to decline in his mobility that started about a year to a year and a half ago. Pt uses a cane for short distances and uses his upright walker for longer distances. Pt has had 3-4 falls (maybe more) in the last six months. He reports no serious injury with falls. Falls have occurred with standing up from  sitting and from tripping over his feet.  Pt reports decreased endurance, where he can ambulate <7 minutes before needing to rest. He can stand with assistance for maybe 10-15 minutes. Pt was recently in ED 12/07/22 where he reports he was diagnoses with pneumonia. PMH consists of spastic ataxia, diabetes, seen ED earlier this month with concerns of weakness and lightheadedness where he was found to have pneumonia, hx of R shoulder bursitis per pt report  PAIN:  Are you having pain? No PRECAUTIONS: Fall  RED FLAGS: Bowel or bladder incontinence: Yes: reports urinary incontinence with progression of his condition    WEIGHT BEARING RESTRICTIONS: No  FALLS: Has patient fallen in last 6 months? Yes. Number of falls 3-4  PATIENT GOALS: improve mobility, strength, balance  OBJECTIVE: taken at eval unless specified otherwise    TODAY'S TREATMENT:                                                                                                                              DATE: 04/29/23  Gait belt donned throughout for pt safety  Manual: Marina  Moser PT assists with session to apply K tape to bilat knee for patellar stabilization, and to provide education on proper maintenance/removal of k tape. X 6 min   NMR:  FWD bend and touch down to table to simulate reaching to pick up object -x multiple reps/rounds from decreasing heights with close CGA.  -then performed with feet planted with twist/diagonal cross-body reaches x multiple reps - then performed with 1/4 turns to cross body reach- pt reports this is a  little tougher   Obstacle course with step tap on airex each LE, step on and over airex,  cone tap x multiple times each LE, and stepping over green airex 2x2 through. Rest breaks between sets   PATIENT EDUCATION: Pt educated throughout session about proper posture and technique with exercises. Improved exercise technique, movement at target joints, use of target muscles after min to mod verbal, visual, tactile cues. Goals .   HOME EXERCISE PROGRAM: 12/31/22: Access Code: [email or web address] URL: [email or web address] Date: 12/31/2022 Prepared by: Darryle Patten  Exercises - Standing Tandem Balance with Counter Support  - 2 x daily - 7 x weekly - 1 sets - 2 reps - 30 seconds hold - Romberg Stance  - 2 x daily - 7 x weekly - 1 sets - 2 reps - 30 seconds hold - written instruction to perform at support surface with UE support  Access Code: [email or web address] URL: [email or web address] Date: 12/25/2022 Prepared by: Darryle Patten  Exercises - Sit to Stand with Counter Support  - 1 x daily - 5 x weekly - 4 sets - 5 reps - Standing March with Counter Support  - 1 x daily - 5 x weekly - 2 sets - 10 reps GOALS:  SHORT TERM GOALS: Target date: 02/05/2023   Patient will be independent in home exercise program to improve strength/mobility for better functional independence with  ADLs. Baseline: initiated Goal status: IN PROGRESS   LONG TERM GOALS: Target date: 03/19/2023   Patient will increase FOTO score to equal to or greater than 51  to demonstrate statistically significant improvement in mobility and quality of life.  Baseline: deferred due to system error; 01/06/23: 42; 10/7: 57 Goal status: MET  2.  Patient (< 68 years old) will complete five times sit to stand test in < 10 seconds indicating increased LE strength and improved balance. Baseline: 21.7 sec; 02/03/23: 21 seconds hands-free (improved eccentric control); 12/18: 18 sec hands-free Goal status: IN PROGRESS  3.  Patient  will increase Berg Balance score by > 6 points to demonstrate decreased fall risk during functional activities Baseline: 32; 02/03/23 36 (most difficulty with SLB, tandem stance) ; 04/16/23 38/56 Goal status: IN PROGRESS  4.  Patient will increase 10 meter walk test to >1.2m/s as to improve gait speed for better community ambulation and to reduce fall risk. Baseline: 0.54 m/s; 02/03/23: 0.59 m/s ; 12/18: 0.76 m/s with RW Goal status: IN PROGRESS  5.  Patient will increase six minute walk test distance to at least 1000 ft for progression to community ambulator and improved gait ability Baseline:  569 ft; 02/03/2023: 746 ft; 12/18: 808 ft Goal status: IN PROGRESS    ASSESSMENT:  CLINICAL IMPRESSION: Session slightly limited secondary to late arrival, however, pt highly motivated to participate. K tape intervention provided today to assist with bilateral patella stability (see above). Pt also continues to perform higher level balance activities, including bending progression where he required no more than close CGA. The pt will benefit from further skilled PT to address these impairments in order to increase functional mobility and decrease fall risk.  OBJECTIVE IMPAIRMENTS: Abnormal gait, decreased activity tolerance, decreased balance, decreased coordination, decreased endurance, decreased mobility, difficulty walking, decreased strength, impaired sensation, impaired tone, improper body mechanics, postural dysfunction, and pain.   ACTIVITY LIMITATIONS: carrying, lifting, standing, squatting, stairs, transfers, continence, and locomotion level  PARTICIPATION LIMITATIONS: cleaning, laundry, shopping, community activity, and yard work  PERSONAL FACTORS: Fitness, Time since onset of injury/illness/exacerbation, and 1-2 comorbidities: PMH consists of spastic ataxia, diabetes,hx of R shoulder bursitis per pt report  are also affecting patient's functional outcome.   REHAB POTENTIAL:  Good  CLINICAL DECISION MAKING: Evolving/moderate complexity  EVALUATION COMPLEXITY: Moderate  PLAN:  PT FREQUENCY: 1-2x/week  PT DURATION: 12 weeks  PLANNED INTERVENTIONS: Therapeutic exercises, Therapeutic activity, Neuromuscular re-education, Balance training, Gait training, Patient/Family education, Self Care, Joint mobilization, Stair training, Vestibular training, Canalith repositioning, Visual/preceptual remediation/compensation, Orthotic/Fit training, DME instructions, Dry Needling, Electrical stimulation, Spinal mobilization, Cryotherapy, Moist heat, Taping, Ultrasound, Manual therapy, and Re-evaluation  PLAN FOR NEXT SESSION: strengthening, gait, static balance, uneven surface balance interventions, continue plan   Darryle JONELLE Patten, PT 04/29/2023, 5:19 PM  5:19 PM, 04/29/23  Physical Therapist - [facility]  Outpatient Physical Therapy- Main Campus [phone]

## 2023-05-06 ENCOUNTER — Ambulatory Visit: Payer: 59 | Attending: Neurology

## 2023-05-06 DIAGNOSIS — G114 Hereditary spastic paraplegia: Secondary | ICD-10-CM | POA: Diagnosis not present

## 2023-05-06 DIAGNOSIS — R2681 Unsteadiness on feet: Secondary | ICD-10-CM | POA: Diagnosis not present

## 2023-05-06 DIAGNOSIS — R262 Difficulty in walking, not elsewhere classified: Secondary | ICD-10-CM | POA: Diagnosis not present

## 2023-05-06 DIAGNOSIS — R278 Other lack of coordination: Secondary | ICD-10-CM | POA: Insufficient documentation

## 2023-05-06 DIAGNOSIS — M6281 Muscle weakness (generalized): Secondary | ICD-10-CM | POA: Insufficient documentation

## 2023-05-06 NOTE — Therapy (Signed)
 OUTPATIENT OCCUPATIONAL THERAPY NEURO TREATMENT  Patient Name: Danny Cooper MRN: [id number] DOB:05-07-1980, 43 y.o., male Today's Date: 05/06/2023  PCP: [facility] REFERRING PROVIDER: Dr. Jannett Fairly  END OF SESSION:  OT End of Session - 05/06/23 1411     Visit Number 4    Number of Visits 24    Date for OT Re-Evaluation 06/17/23    Progress Note Due on Visit 10    OT Start Time 1407    OT Stop Time 1445    OT Time Calculation (min) 38 min    Activity Tolerance Patient tolerated treatment well    Behavior During Therapy WFL for tasks assessed/performed            Past Medical History:  Diagnosis Date   Diabetes mellitus without complication (HCC)    Spastic ataxia, hereditary (HCC)    Past Surgical History:  Procedure Laterality Date   CARDIAC SURGERY     age 30   There are no active problems to display for this patient.  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  Pt accompanied by: self  PERTINENT HISTORY:  Pt reports his physical decline started when Covid hit, and at this time, pt was dx with hereditary spastic paraplegia.  Per medical record, pt was recently in ED 12/07/22 where he reports he was diagnosed with pneumonia.  Pt verbalizes concern with weakness and worsening FMC, and wants to work to maximize his indep.   PMH consists of spastic ataxia, diabetes, seen ED earlier this month with concerns of weakness and lightheadedness where he was found to have pneumonia, hx of R shoulder bursitis per pt report.  PRECAUTIONS: Fall  WEIGHT BEARING RESTRICTIONS: No  PAIN:  Are you having pain? No, occasional LBP  FALLS: Has patient fallen  in last 6 months? Yes. 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  Pt does drive.  PATIENT GOALS: Improve coordination and be stronger in my abilities.  OBJECTIVE:  Note: Objective measures were completed at Evaluation unless otherwise noted.  HAND DOMINANCE: Right  ADLs: Overall ADLs: spouse assists on occasion with clothing fasteners, otherwise pt manages basic ADLs with extra time-modified indep, increased effort Transfers/ambulation related to ADLs: occasional help out of bed (pt verbalizes wanting a bed rail) Eating: able to cut food using standard utensils Grooming: indep UB Dressing: pt reports being able to manage button up shirts LB Dressing: difficulty managing buttons on pants; wears slip on shoes d/t difficulty pulling shoes on and sometimes difficulty bending down.  Pt reports he has to lift legs up to bed to don socks and to thread legs through pants  Toileting: Difficulty with clothing fasteners; uses a toilet wand for hygiene Bathing: Takes shower standing up, but  this causes low back pain; hx of a fall in shower but it's been a long time.  Pt does verbalize desire for a shower chair Tub Shower transfers: modified indep with tub/shower combo, but hx of falls Equipment: suction grab bars; pt would like a shower seat   IADLs: Shopping: spouse has started to take over shopping d/t it taking pt too long  Light housekeeping: able to push a vacuum with 1 hand on walker  Meal  Prep: spouse mostly manages; pt can use the microwave, modified indep with cold/light meal prep Community mobility: stand up walker and pt does drive Medication management: pt sets up his own pills in a pill Database Administrator management: spouse primarily manages  Handwriting: 100% legible  MOBILITY STATUS: Hx of falls  POSTURE COMMENTS:  rounded shoulders  ACTIVITY TOLERANCE: Activity tolerance: TBD within functional contexts  FUNCTIONAL OUTCOME MEASURES: FOTO: 40; predicted 54  UPPER EXTREMITY ROM:  BUEs WFL  UPPER EXTREMITY MMT:   BUEs grossly 4/5  HAND FUNCTION: Mean grip strength for male age 81-44 yrs in lbs: R mean 116.8 (low 84 - high 165) L mean 112.8 (low 73 - high 157)  Grip strength: Right: 62 lbs; Left: 62 lbs, Lateral pinch: Right: 17 lbs, Left: 17 lbs, and 3 point pinch: Right: 21 lbs, Left: 12 lbs  COORDINATION: 9 Hole Peg test: Right: 60 sec; Left: 61 sec  SENSATION: Pt verbalizes occasional tingling in R/L thumbs  EDEMA: No visible edema in BUEs  MUSCLE TONE: LE spasticity, BUEs more ataxic than spastic  COGNITION: Overall cognitive status: Within functional limits for tasks assessed  VISION: Subjective report: wears glasses; pt verbalizes likeliness of needing a change in his Rx glasses  VISION ASSESSMENT: Not tested; time constraints.  Will test in upcoming sessions as needed or within functional contexts   PERCEPTION: Not tested  PRAXIS: Impaired: Motor planning, mild-moderate ataxia in BUEs  OBSERVATIONS:  Pt very pleasant and verbalizes desire to maximize indep.  Pt appeared almost tearful when discussing his difficulty managing his clothing fasteners at a previous PT session, and appears very motivated to work towards goals in [place].  TODAY'S TREATMENT:                                                                                                                              DATE: 05/06/23: Therapeutic Activity: Facilitated lateral pinch  strengthening with use of therapy resistant clothespins, alternating between R/L hands, in order to target sustained pinching to ease management of buttons on pants.  Pt worked with all colors of clothespins in each hand, placing and removing pins onto vertical and horizontal dowels.  OT adjusted positioning of dowels to promote forward and lateral reaching patterns to challenge GMC.  Further challenged GMC with reaching toward a smaller target by having pt clip clothespins onto the end of the dowel while completing forward and lateral reaching patterns.  Additional GMC targeted with pt performing pron/sup movements using  a lateral pinch to clip pins on/off a horiz dowel; vc to increase speed with 2nd and 3rd trials on each hand.  PATIENT EDUCATION: Education details: FMC/theraputty HEP Person educated: Patient Education method: Explanation Education comprehension: verbalized understanding  HOME EXERCISE PROGRAM: Green theraputty exercises, FMC/functional hand exercises  GOALS: Goals reviewed with patient? Yes  SHORT TERM GOALS: Target date: 05/06/23  Pt will be indep to perform HEP for improving bilat hand strength and coordination for daily tasks. Baseline: Eval: HEP not yet initiated Goal status: INITIAL  2.  Pt will identify and implement 2-3 activity modifications and/or pieces of AE in order to reduce fall risk with daily tasks. Baseline: Eval: Fall prevention educ not yet initiated Goal status: INITIAL  LONG TERM GOALS: Target date: 06/17/23  Pt will increase FOTO score to 54 or better to indicate improvement in self perceived performance with assessment specific daily tasks. Baseline: Eval: 40 Goal status: INITIAL  2.  Pt will increase bilat grip strength by 10 or more lbs in order to improve ability to hold and transport heavy grocery bags. Baseline: Eval: Bilat grip strength 62 lbs (below mean of male of same age range; see above) Goal status: INITIAL  3.  Pt will improve  bilat FMC/dexterity skills to better manage clothing fasteners as demonstrated by 10 sec or more improvement on 9 hole peg test bilaterally.  Baseline: Eval: R: 60 sec, L 61 sec; difficulty managing buttons on pants  Goal status: INITIAL  4.  Pt will improve bilat GMC in order to improve accuracy when reaching to put dishes away. Baseline: Eval: mild-moderate ataxia in BUE. Goal status: INITIAL  ASSESSMENT:  CLINICAL IMPRESSION: Pt reports that he did try to work with the button hook issued last session, but felt like it was not effective when working to button a variety of pants.  Pt tolerated sustained pinching activity well this date, alternating between R/L hands throughout session.  Pt continues to be challenged with reaching toward a target in either hand.  Pt will continue to benefit from skilled OT to target bilat FMC/GMC skills in order to improve efficiency with small item manipulation, to improve accuracy with functional reaching, and continued ADL/AE training in order to maximize indep with ADLs, and to reduce fall prevention in the home.   PERFORMANCE DEFICITS: in functional skills including ADLs, IADLs, coordination, dexterity, sensation, tone, strength, pain, Fine motor control, Gross motor control, mobility, balance, body mechanics, endurance, continence, decreased knowledge of use of DME, and UE functional use, cognitive skills including emotional, and psychosocial skills including coping strategies, environmental adaptation, habits, and routines and behaviors.   IMPAIRMENTS: are limiting patient from ADLs, IADLs, work, leisure, and social participation.   CO-MORBIDITIES: may have co-morbidities  that affects occupational performance. Patient will benefit from skilled OT to address above impairments and improve overall function.  MODIFICATION OR ASSISTANCE TO COMPLETE EVALUATION: No modification of tasks or assist necessary to complete an evaluation.  OT OCCUPATIONAL PROFILE AND  HISTORY: Problem focused assessment: Including review of records relating to presenting problem.  CLINICAL DECISION MAKING: Moderate - several treatment options, min-mod task modification necessary  REHAB POTENTIAL: Good  EVALUATION COMPLEXITY: Moderate    PLAN:  OT FREQUENCY: 1-2x/week  OT DURATION: 12 weeks  PLANNED INTERVENTIONS: 97168 OT Re-evaluation, 97535 self care/ADL training, 02889 therapeutic exercise, 97530 therapeutic activity, 97112 neuromuscular re-education, 97140 manual therapy, 97116 gait training, 02989 moist heat, 97010 cryotherapy, passive range of motion, balance training, functional mobility training, psychosocial skills training, energy  conservation, coping strategies training, patient/family education, and DME and/or AE instructions  RECOMMENDED OTHER SERVICES:   CONSULTED AND AGREED WITH PLAN OF CARE: Patient  PLAN FOR NEXT SESSION: see above  Inocente Blazing, MS, OTR/L  Inocente MARLA Blazing, OT 05/06/2023, 5:01 PM

## 2023-05-07 ENCOUNTER — Ambulatory Visit: Payer: 59

## 2023-05-13 ENCOUNTER — Ambulatory Visit: Payer: 59

## 2023-05-13 DIAGNOSIS — R278 Other lack of coordination: Secondary | ICD-10-CM | POA: Diagnosis not present

## 2023-05-13 DIAGNOSIS — M6281 Muscle weakness (generalized): Secondary | ICD-10-CM

## 2023-05-13 DIAGNOSIS — R262 Difficulty in walking, not elsewhere classified: Secondary | ICD-10-CM

## 2023-05-13 DIAGNOSIS — R2681 Unsteadiness on feet: Secondary | ICD-10-CM | POA: Diagnosis not present

## 2023-05-13 DIAGNOSIS — G114 Hereditary spastic paraplegia: Secondary | ICD-10-CM | POA: Diagnosis not present

## 2023-05-13 NOTE — Therapy (Signed)
 OUTPATIENT PHYSICAL THERAPY TREATMENT   Patient Name: Danny Cooper MRN: [id number] DOB:Jun 20, 1980, 43 y.o., male Today's Date: 05/13/2023   PCP: [facility]  REFERRING PROVIDER:   Maree Jannett POUR, MD    END OF SESSION:  PT End of Session - 05/13/23 1451     Visit Number 17    Number of Visits 39    Date for PT Re-Evaluation 07/09/23    Authorization Type [employer]    Progress Note Due on Visit 10    PT Start Time 1450    PT Stop Time 1529    PT Time Calculation (min) 39 min    Equipment Utilized During Treatment Gait belt    Activity Tolerance Patient tolerated treatment well    Behavior During Therapy WFL for tasks assessed/performed                  Past Medical History:  Diagnosis Date   Diabetes mellitus without complication (HCC)    Spastic ataxia, hereditary (HCC)    Past Surgical History:  Procedure Laterality Date   CARDIAC SURGERY     age 27   There are no active problems to display for this patient.   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  Occurred at his home prior to PT session, slipped on ice outside his house and fell. Got up indep. He reports he did not hit his head, fell on his bottom, but reports it didn't really hurt.  Pt reports he has noticed improved ability to stand up from his couch. Reports some difficulty using his rollator at home, particularly going into his son's room due to navigating objects on the floor.    Pt accompanied by: self  PERTINENT HISTORY:   The pt is a pleasant 43 yo male with hereditary spastic paraplegia ambulating with upright walker referred for PT eval. Pt would like to improve his strength, mobility, balance and gait. He was previously seen in clinic years ago following diagnosis. Pt reports he returns now due to decline in his mobility that started about a year to a year and a half ago.  Pt uses a cane for short distances and uses his upright walker for longer distances. Pt has had 3-4 falls (maybe more) in the last six months. He reports no serious injury with falls. Falls have occurred with standing up from sitting and from tripping over his feet.  Pt reports decreased endurance, where he can ambulate <7 minutes before needing to rest. He can stand with assistance for maybe 10-15 minutes. Pt was recently in ED 12/07/22 where he reports he was diagnoses with pneumonia. PMH consists of spastic ataxia, diabetes, seen ED earlier this month with concerns of weakness and lightheadedness where he was found to have pneumonia, hx of R shoulder bursitis per pt report  PAIN:  Are you having pain? No PRECAUTIONS: Fall  RED FLAGS: Bowel or bladder incontinence: Yes: reports urinary incontinence with progression of his condition    WEIGHT BEARING RESTRICTIONS: No  FALLS: Has patient fallen in last 6 months? Yes. Number of falls 3-4  PATIENT GOALS: improve mobility, strength, balance  OBJECTIVE: taken at eval unless specified otherwise    TODAY'S TREATMENT:                                                                                                                              DATE: 05/13/23  Gait belt donned throughout for pt safety, CGA provided unless specified otherwise   TE: Alt step-ups onto 6 step with UE support 2x10 each LE Step up and down from 6 step with UE support 10x, CGA-min assist. Reports more difficult leading with LLE than RLE. STS 8x rates easy  STS  with green pad under one LE 8x each LE - close CGA    NMR: Obstacle course with walker: focus on foot clearance and stepping over multiple objects  4 objects of different heights and airex pad 4x  through Standing NBOS 4x30 sec  Semi-tandem 2x30 sec each LE - more challenging     PATIENT EDUCATION: Pt educated throughout session about proper posture and technique with exercises. Improved exercise technique, movement at target joints, use of target muscles after min to mod verbal, visual, tactile cues.  SABRA   HOME EXERCISE PROGRAM: 12/31/22: Access Code: [email or web address] URL: [email or web address] Date: 12/31/2022 Prepared by: Darryle Patten  Exercises - Standing Tandem Balance with Counter Support  - 2 x daily - 7 x weekly - 1 sets - 2 reps - 30 seconds hold - Romberg Stance  - 2 x daily - 7 x weekly - 1 sets - 2 reps - 30 seconds hold - written instruction to perform at support surface with UE support  Access Code: [email or web address] URL: [email or web address] Date: 12/25/2022 Prepared by: Darryle Patten  Exercises - Sit to Stand with Counter Support  - 1 x daily - 5 x weekly - 4 sets - 5 reps - Standing March with Counter Support  - 1 x daily - 5 x weekly - 2 sets - 10 reps GOALS:  SHORT TERM GOALS: Target  date: 02/05/2023   Patient will be independent in home exercise program to improve strength/mobility for better functional independence with ADLs. Baseline: initiated Goal status: IN PROGRESS   LONG TERM GOALS: Target date: 03/19/2023   Patient will increase FOTO score to equal to or greater than 51  to demonstrate statistically significant improvement in mobility and quality of life.  Baseline: deferred due to system error; 01/06/23: 42; 10/7: 57 Goal status: MET  2.  Patient (< 14 years old) will complete five times sit to stand test in < 10 seconds indicating increased LE strength and improved balance. Baseline: 21.7 sec; 02/03/23: 21 seconds hands-free (improved  eccentric control); 12/18: 18 sec hands-free Goal status: IN PROGRESS  3.  Patient will increase Berg Balance score by > 6 points to demonstrate decreased fall risk during functional activities Baseline: 32; 02/03/23 36 (most difficulty with SLB, tandem stance) ; 04/16/23 38/56 Goal status: IN PROGRESS  4.  Patient will increase 10 meter walk test to >1.105m/s as to improve gait speed for better community ambulation and to reduce fall risk. Baseline: 0.54 m/s; 02/03/23: 0.59 m/s ; 12/18: 0.76 m/s with RW Goal status: IN PROGRESS  5.  Patient will increase six minute walk test distance to at least 1000 ft for progression to community ambulator and improved gait ability Baseline:  569 ft; 02/03/2023: 746 ft; 12/18: 808 ft Goal status: IN PROGRESS    ASSESSMENT:  CLINICAL IMPRESSION: Pt able to advance to more challenging STS variations without observed difficulty, indicating improved BLE strength. PT also observe improvement with pt obstacle clearance during obstacle course, requiring no greater than CGA. The pt will benefit from further skilled PT to address these impairments in order to increase functional mobility and decrease fall risk.  OBJECTIVE IMPAIRMENTS: Abnormal gait, decreased activity tolerance, decreased balance, decreased coordination, decreased endurance, decreased mobility, difficulty walking, decreased strength, impaired sensation, impaired tone, improper body mechanics, postural dysfunction, and pain.   ACTIVITY LIMITATIONS: carrying, lifting, standing, squatting, stairs, transfers, continence, and locomotion level  PARTICIPATION LIMITATIONS: cleaning, laundry, shopping, community activity, and yard work  PERSONAL FACTORS: Fitness, Time since onset of injury/illness/exacerbation, and 1-2 comorbidities: PMH consists of spastic ataxia, diabetes,hx of R shoulder bursitis per pt report  are also affecting patient's functional outcome.   REHAB POTENTIAL: Good  CLINICAL DECISION  MAKING: Evolving/moderate complexity  EVALUATION COMPLEXITY: Moderate  PLAN:  PT FREQUENCY: 1-2x/week  PT DURATION: 12 weeks  PLANNED INTERVENTIONS: Therapeutic exercises, Therapeutic activity, Neuromuscular re-education, Balance training, Gait training, Patient/Family education, Self Care, Joint mobilization, Stair training, Vestibular training, Canalith repositioning, Visual/preceptual remediation/compensation, Orthotic/Fit training, DME instructions, Dry Needling, Electrical stimulation, Spinal mobilization, Cryotherapy, Moist heat, Taping, Ultrasound, Manual therapy, and Re-evaluation  PLAN FOR NEXT SESSION: strengthening, gait, static balance, uneven surface balance interventions, continue plan   Darryle JONELLE Patten, PT 05/13/2023, 4:28 PM  4:28 PM, 05/13/23  Physical Therapist - [facility]  Outpatient Physical Therapy- Main Campus [phone]

## 2023-05-14 ENCOUNTER — Ambulatory Visit: Payer: 59

## 2023-05-21 ENCOUNTER — Ambulatory Visit: Payer: 59

## 2023-05-27 ENCOUNTER — Ambulatory Visit: Payer: 59

## 2023-05-28 ENCOUNTER — Ambulatory Visit: Payer: 59

## 2023-06-03 ENCOUNTER — Ambulatory Visit: Payer: 59 | Attending: Neurology

## 2023-06-03 ENCOUNTER — Telehealth: Payer: Self-pay

## 2023-06-03 DIAGNOSIS — R2681 Unsteadiness on feet: Secondary | ICD-10-CM | POA: Insufficient documentation

## 2023-06-03 DIAGNOSIS — R278 Other lack of coordination: Secondary | ICD-10-CM | POA: Insufficient documentation

## 2023-06-03 DIAGNOSIS — M6281 Muscle weakness (generalized): Secondary | ICD-10-CM | POA: Insufficient documentation

## 2023-06-03 DIAGNOSIS — R262 Difficulty in walking, not elsewhere classified: Secondary | ICD-10-CM | POA: Insufficient documentation

## 2023-06-03 NOTE — Telephone Encounter (Signed)
Left pt vm re: his missed OT appt and encouraged pt return call to schedule for another day this week if possible. Advised pt of his upcoming appts and reminded to call us if unable to make these appts. Rehab # provided.   Danelle Earthly, MS, OTR/L

## 2023-06-04 ENCOUNTER — Ambulatory Visit: Payer: 59

## 2023-06-10 ENCOUNTER — Ambulatory Visit: Payer: 59

## 2023-06-10 DIAGNOSIS — R262 Difficulty in walking, not elsewhere classified: Secondary | ICD-10-CM | POA: Diagnosis present

## 2023-06-10 DIAGNOSIS — R278 Other lack of coordination: Secondary | ICD-10-CM | POA: Diagnosis present

## 2023-06-10 DIAGNOSIS — R2681 Unsteadiness on feet: Secondary | ICD-10-CM | POA: Diagnosis present

## 2023-06-10 DIAGNOSIS — M6281 Muscle weakness (generalized): Secondary | ICD-10-CM

## 2023-06-10 NOTE — Therapy (Signed)
 OUTPATIENT PHYSICAL THERAPY TREATMENT   Patient Name: Danny Cooper MRN: [id number] DOB:[birth year], 43 y.o., male Today's Date: 06/10/2023   PCP: [facility]  REFERRING PROVIDER:   Lonell Face, MD    END OF SESSION:  PT End of Session - 06/10/23 1414     Visit Number 18    Number of Visits 39    Date for PT Re-Evaluation 07/09/23    Authorization Type [employer]    Progress Note Due on Visit 10    PT Start Time 1414    PT Stop Time 1445    PT Time Calculation (min) 31 min    Equipment Utilized During Treatment Gait belt    Activity Tolerance Patient tolerated treatment well    Behavior During Therapy WFL for tasks assessed/performed                   Past Medical History:  Diagnosis Date   Diabetes mellitus without complication (HCC)    Spastic ataxia, hereditary (HCC)    Past Surgical History:  Procedure Laterality Date   CARDIAC SURGERY     age 48   There are no active problems to display for this patient.   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 He doesn't think he has fallen since last seen, no near falls. Pt has a LE sleeve bilat to help with circulation.   Pt accompanied by: self  PERTINENT HISTORY:   The pt is a pleasant 43 yo male with hereditary spastic paraplegia ambulating with upright walker referred for PT eval. Pt would like to improve his strength, mobility, balance and gait. He was previously seen in clinic years ago  following diagnosis. Pt reports he returns now due to decline in his mobility that started about "a year to a year and a half ago." Pt uses a cane for short distances and uses his upright walker for longer distances. Pt has had 3-4 falls (maybe more) in the last six months. He reports no serious injury with falls. Falls have occurred with standing up from sitting and from tripping over his feet.  Pt reports decreased  endurance, where he can ambulate <7 minutes before needing to rest. He can stand with assistance for maybe 10-15 minutes. Pt was recently in ED 12/07/22 where he reports he was diagnoses with pneumonia. PMH consists of spastic ataxia, diabetes, seen ED earlier this month with concerns of weakness and lightheadedness where he was found to have pneumonia, hx of R shoulder bursitis per pt report  PAIN:  Are you having pain? No PRECAUTIONS: Fall  RED FLAGS: Bowel or bladder incontinence: Yes: reports urinary incontinence with progression of his condition    WEIGHT BEARING RESTRICTIONS: No  FALLS: Has patient fallen in last 6 months? Yes. Number of falls 3-4  PATIENT GOALS: improve mobility, strength, balance  OBJECTIVE: taken at eval unless specified otherwise    TODAY'S TREATMENT:                                                                                                                              DATE: 06/10/23  Gait belt donned throughout for pt safety, CGA provided unless specified otherwise   Physical Performance:  Comments: see goal section & assessment below for details. PT provided education on results  TA:  STS 2x5, hands-free   - for strength/balance  Standing on airex WBOS x 30 sec with decreasing levels of UE to no UE support Discussion of results of testing on progress & plan, reviewed pt personal goals and current PT goals.   PATIENT EDUCATION: Pt educated throughout session about proper posture and technique with exercises. Improved exercise  technique, movement at target joints, use of target muscles after min to mod verbal, visual, tactile cues.  Marland Kitchen   HOME EXERCISE PROGRAM: 12/31/22: Access Code: [email or web address] URL: [email or web address] Date: 12/31/2022 Prepared by: Temple Pacini  Exercises - Standing Tandem Balance with Counter Support  - 2 x daily - 7 x weekly - 1 sets - 2 reps - 30 seconds hold - Romberg Stance  - 2 x daily - 7 x weekly - 1 sets - 2 reps - 30 seconds hold - written instruction to perform at support surface with UE support  Access Code: [email or web address] URL: [email or web address] Date: 12/25/2022 Prepared by: Temple Pacini  Exercises - Sit to Stand with Counter Support  - 1 x daily - 5 x weekly - 4 sets - 5 reps - Standing March with Counter Support  - 1 x daily - 5 x weekly - 2 sets - 10 reps GOALS:  SHORT TERM GOALS: Target date: 02/05/2023   Patient will be independent in home exercise program to improve strength/mobility for better functional independence with ADLs. Baseline: initiated Goal status: IN PROGRESS   LONG TERM GOALS: Target date: 03/19/2023   Patient will increase FOTO score to equal to or greater than 51  to demonstrate statistically significant improvement in mobility and quality of life.  Baseline: deferred due to system error; 01/06/23: 42; 10/7: 57 Goal status: MET  2.  Patient (< 33 years old) will  complete five times sit to stand test in < 10 seconds indicating increased LE strength and improved balance. Baseline: 21.7 sec; 02/03/23: 21 seconds hands-free (improved eccentric control); 12/18: 18 sec hands-free; 06/10/23:  20.5 seconds hands-free Goal status: IN PROGRESS  3.  Patient will increase Berg Balance score by > 6 points to demonstrate decreased fall risk during functional activities Baseline: 32; 02/03/23 36 (most difficulty with SLB, tandem stance) ; 04/16/23 38/56 Goal status: IN PROGRESS  4.  Patient will increase 10 meter walk test to >1.68m/s as to  improve gait speed for better community ambulation and to reduce fall risk. Baseline: 0.54 m/s; 02/03/23: 0.59 m/s ; 12/18: 0.76 m/s with RW; 06/10/23: 0.82 m/s with RW Goal status: IN PROGRESS  5.  Patient will increase six minute walk test distance to at least 1000 ft for progression to community ambulator and improved gait ability Baseline:  569 ft; 02/03/2023: 746 ft; 12/18: 808 ft; 06/10/23: 848 ft  Goal status: IN PROGRESS    ASSESSMENT:  CLINICAL IMPRESSION: Goal reassessment initiated since pt not seen in PT since 05/13/2023. Despite absence, pt has made gains on both and , indicating improved gait ability, speed and functional capacity. PT reviewed plan of care with pt, and pt agreeable and would like to try continuing PT for another month and decide what he'd like to do next. The pt will benefit from further skilled PT to address these impairments in order to increase functional mobility and decrease fall risk.  OBJECTIVE IMPAIRMENTS: Abnormal gait, decreased activity tolerance, decreased balance, decreased coordination, decreased endurance, decreased mobility, difficulty walking, decreased strength, impaired sensation, impaired tone, improper body mechanics, postural dysfunction, and pain.   ACTIVITY LIMITATIONS: carrying, lifting, standing, squatting, stairs, transfers, continence, and locomotion level  PARTICIPATION LIMITATIONS: cleaning, laundry, shopping, community activity, and yard work  PERSONAL FACTORS: Fitness, Time since onset of injury/illness/exacerbation, and 1-2 comorbidities: PMH consists of spastic ataxia, diabetes,hx of R shoulder bursitis per pt report  are also affecting patient's functional outcome.   REHAB POTENTIAL: Good  CLINICAL DECISION MAKING: Evolving/moderate complexity  EVALUATION COMPLEXITY: Moderate  PLAN:  PT FREQUENCY: 1-2x/week  PT DURATION: 12 weeks  PLANNED INTERVENTIONS: Therapeutic exercises, Therapeutic activity, Neuromuscular  re-education, Balance training, Gait training, Patient/Family education, Self Care, Joint mobilization, Stair training, Vestibular training, Canalith repositioning, Visual/preceptual remediation/compensation, Orthotic/Fit training, DME instructions, Dry Needling, Electrical stimulation, Spinal mobilization, Cryotherapy, Moist heat, Taping, Ultrasound, Manual therapy, and Re-evaluation  PLAN FOR NEXT SESSION: strengthening, gait, static balance, uneven surface balance interventions, continue plan   Baird Kay, PT 06/10/2023, 5:33 PM  5:33 PM, 06/10/23  Physical Therapist - [facility]  Outpatient Physical Therapy- Main Campus [phone]

## 2023-06-11 ENCOUNTER — Ambulatory Visit: Payer: 59

## 2023-06-18 ENCOUNTER — Ambulatory Visit: Payer: 59

## 2023-06-24 ENCOUNTER — Ambulatory Visit: Payer: 59

## 2023-06-25 ENCOUNTER — Ambulatory Visit: Payer: 59

## 2023-07-01 ENCOUNTER — Ambulatory Visit: Payer: 59 | Attending: Neurology

## 2023-07-01 DIAGNOSIS — R262 Difficulty in walking, not elsewhere classified: Secondary | ICD-10-CM | POA: Insufficient documentation

## 2023-07-01 DIAGNOSIS — M6281 Muscle weakness (generalized): Secondary | ICD-10-CM | POA: Insufficient documentation

## 2023-07-01 DIAGNOSIS — R278 Other lack of coordination: Secondary | ICD-10-CM | POA: Insufficient documentation

## 2023-07-01 DIAGNOSIS — R2681 Unsteadiness on feet: Secondary | ICD-10-CM | POA: Insufficient documentation

## 2023-07-01 DIAGNOSIS — G114 Hereditary spastic paraplegia: Secondary | ICD-10-CM | POA: Insufficient documentation

## 2023-07-01 NOTE — Therapy (Signed)
 Pt arrived for OT after multiple missed visits, not having been seen by OT since 05/06/23.  OT explained to pt that his OT certification period ended on 06/17/23, and that if pt wanted to continue with OT and work towards goals in [place], frequency of at least 1x a week would be recommended in order to expect some progress towards goals.  Pt explained to OT that he's been going through some family and work hardships, and expects to move to [facility] mid April because of this.  Pt explained that he is unable to commit to a regular therapy schedule for OT at this time as his son is currently unable to be in daycare, so pt must be home with him when his wife is working.  Pt anticipates being unable to attend therapy regularly once he moves, d/t the driving distance being just over 45 min to the clinic.  OT made recommendations for reaching out to his MD for a new therapy referral as needed once his schedule allows, and to look into other therapy clinics that may be closer in or around [facility], possibly [place] or [place].  Pt verbalized understanding and was in agreement to discontinue OT at this time.   Danelle Earthly, MS, OTR/L

## 2023-07-02 ENCOUNTER — Ambulatory Visit: Payer: 59

## 2023-07-08 ENCOUNTER — Ambulatory Visit: Payer: 59

## 2023-07-08 DIAGNOSIS — G114 Hereditary spastic paraplegia: Secondary | ICD-10-CM | POA: Diagnosis present

## 2023-07-08 DIAGNOSIS — R262 Difficulty in walking, not elsewhere classified: Secondary | ICD-10-CM

## 2023-07-08 DIAGNOSIS — R2681 Unsteadiness on feet: Secondary | ICD-10-CM | POA: Diagnosis present

## 2023-07-08 DIAGNOSIS — M6281 Muscle weakness (generalized): Secondary | ICD-10-CM | POA: Diagnosis present

## 2023-07-08 DIAGNOSIS — R278 Other lack of coordination: Secondary | ICD-10-CM | POA: Diagnosis present

## 2023-07-08 NOTE — Therapy (Signed)
 OUTPATIENT PHYSICAL THERAPY TREATMENT/DISCHARGE   Patient Name: Danny Cooper MRN: [id number] DOB:[birth year], 43 y.o., male Today's Date: 07/08/2023   PCP: [facility]  REFERRING PROVIDER:   Lonell Face, MD    END OF SESSION:  PT End of Session - 07/08/23 1407     Visit Number 19    Number of Visits 39    Date for PT Re-Evaluation 07/09/23    Authorization Type [employer]    Progress Note Due on Visit 10    PT Start Time 1409    PT Stop Time 1445    PT Time Calculation (min) 36 min    Equipment Utilized During Treatment Gait belt    Activity Tolerance Patient tolerated treatment well    Behavior During Therapy WFL for tasks assessed/performed                   Past Medical History:  Diagnosis Date   Diabetes mellitus without complication (HCC)    Spastic ataxia, hereditary (HCC)    Past Surgical History:  Procedure Laterality Date   CARDIAC SURGERY     age 51   There are no active problems to display for this patient.   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 Pt reports a couple falls since last seen. He reports no injury with falls. Last fall occurred when pt stood back up from bending, possibly tripped over something. Agreeable to today being last PT visit and discharging. Top priority is controlling balance.   Pt accompanied by: self  PERTINENT HISTORY:   The pt is a pleasant 43 yo male with hereditary spastic paraplegia ambulating with  upright walker referred for PT eval. Pt would like to improve his strength, mobility, balance and gait. He was previously seen in clinic years ago following diagnosis. Pt reports he returns now due to decline in his mobility that started about "a year to a year and a half ago." Pt uses a cane for short distances and uses his upright walker for longer distances. Pt has had 3-4 falls (maybe more) in the last six months. He reports no serious injury  with falls. Falls have occurred with standing up from sitting and from tripping over his feet.  Pt reports decreased endurance, where he can ambulate <7 minutes before needing to rest. He can stand with assistance for maybe 10-15 minutes. Pt was recently in ED 12/07/22 where he reports he was diagnoses with pneumonia. PMH consists of spastic ataxia, diabetes, seen ED earlier this month with concerns of weakness and lightheadedness where he was found to have pneumonia, hx of R shoulder bursitis per pt report  PAIN:  Are you having pain? No PRECAUTIONS: Fall  RED FLAGS: Bowel or bladder incontinence: Yes: reports urinary incontinence with progression of his condition    WEIGHT BEARING RESTRICTIONS: No  FALLS: Has patient fallen in last 6 months? Yes. Number of falls 3-4  PATIENT GOALS: improve mobility, strength, balance  OBJECTIVE: taken at eval unless specified otherwise    TODAY'S TREATMENT:                                                                                                                              DATE: 07/08/23  Gait belt donned throughout for pt safety, CGA provided unless specified otherwise   Physical Performance: BERG - 43/56 to assess pt balance 5xSTS - 20 sec  to assess LE strength/fall risk    TA: STS 10x   Review of HEP with pt completing at least 1-2 sets of each of the following in session with reps as prescribed to confirm correct technique. PT also reviewed safety with HEP/how to progress independently.  Access  Code: [email or web address] URL: [email or web address] Date: 07/08/2023 Prepared by: Temple Pacini  Exercises - Sit to Stand  - 1 x daily - 5 x weekly - 3 sets - 10 reps - Squat  - 1 x daily - 5 x weekly - 2 sets - 5 reps - 3-15 second hold - Wide Stance with Counter Support  - 1 x daily - 5 x weekly - 2 sets - 30 seconds *corrected in writing to 2 sets 30 seconds - Walking  - 1 x daily - 6-7 x weekly - 3 sets - 1 reps - 2 minutes/set hold Written instructions to use walker for gait, perform other activities at support surface, start with UE support until easy then can take away support over time   Pt agreeable to d/c    PATIENT EDUCATION: Pt educated throughout session about proper posture and technique with exercises. Improved exercise technique, movement at target joints, use of target muscles after min to mod verbal, visual, tactile cues.  Marland Kitchen   HOME EXERCISE PROGRAM: 12/31/22: Access Code: [email or web address] URL: [email or web address] Date: 12/31/2022 Prepared by: Temple Pacini  Exercises - Standing Tandem Balance with Counter Support  - 2 x daily - 7 x weekly - 1 sets - 2 reps - 30 seconds hold - Romberg Stance  - 2 x daily - 7 x weekly - 1 sets - 2 reps - 30 seconds hold - written instruction  to perform at support surface with UE support  Access Code: [facility] URL: [email or web address] Date: 12/25/2022 Prepared by: Temple Pacini  Exercises - Sit to Stand with Counter Support  - 1 x daily - 5 x weekly - 4 sets - 5 reps - Standing March with Counter Support  - 1 x daily - 5 x weekly - 2 sets - 10 reps GOALS:  SHORT TERM GOALS: Target date: 02/05/2023   Patient will be independent in home exercise program to improve strength/mobility for better functional independence with ADLs. Baseline: initiated Goal status: IN PROGRESS   LONG TERM GOALS: Target date: 03/19/2023   Patient will increase FOTO score to equal to or greater than 51  to demonstrate  statistically significant improvement in mobility and quality of life.  Baseline: deferred due to system error; 01/06/23: 42; 10/7: 57 Goal status: MET/DISCONTINUED  2.  Patient (< 55 years old) will complete five times sit to stand test in < 10 seconds indicating increased LE strength and improved balance. Baseline: 21.7 sec; 02/03/23: 21 seconds hands-free (improved eccentric control); 12/18: 18 sec hands-free; 06/10/23:  20.5 seconds hands-free; 3/11: 20 sec  Goal status: IN PROGRESS  3.  Patient will increase Berg Balance score by > 6 points to demonstrate decreased fall risk during functional activities Baseline: 32; 02/03/23 36 (most difficulty with SLB, tandem stance) ; 04/16/23 38/56; 3/11: 43 Goal status: MET  4.  Patient will increase 10 meter walk test to >1.29m/s as to improve gait speed for better community ambulation and to reduce fall risk. Baseline: 0.54 m/s; 02/03/23: 0.59 m/s ; 12/18: 0.76 m/s with RW; 06/10/23: 0.82 m/s with RW Goal status: IN PROGRESS  5.  Patient will increase six minute walk test distance to at least 1000 ft for progression to community ambulator and improved gait ability Baseline:  569 ft; 02/03/2023: 746 ft; 12/18: 808 ft; 06/10/23: 848 ft  Goal status: IN PROGRESS    ASSESSMENT:  CLINICAL IMPRESSION: Further goal reassessment completed today (pt last seen 06/10/2023). Pt has MET BERG goal, indicating improved balance, although pt still at risk for falls. Pt with no significant change in 5xSTS score. Pt agreeable to making today last PT visit as he is not able to come to PT at prescribed frequency at this time. HEP updated and reviewed during visit for d/c. PT advised pt to return to PT in the future when he can return for necessary frequency to make gains. Pt agreeable to all.   OBJECTIVE IMPAIRMENTS: Abnormal gait, decreased activity tolerance, decreased balance, decreased coordination, decreased endurance, decreased mobility, difficulty walking, decreased  strength, impaired sensation, impaired tone, improper body mechanics, postural dysfunction, and pain.   ACTIVITY LIMITATIONS: carrying, lifting, standing, squatting, stairs, transfers, continence, and locomotion level  PARTICIPATION LIMITATIONS: cleaning, laundry, shopping, community activity, and yard work  PERSONAL FACTORS: Fitness, Time since onset of injury/illness/exacerbation, and 1-2 comorbidities: PMH consists of spastic ataxia, diabetes,hx of R shoulder bursitis per pt report  are also affecting patient's functional outcome.   REHAB POTENTIAL: Good  CLINICAL DECISION MAKING: Evolving/moderate complexity  EVALUATION COMPLEXITY: Moderate  PLAN:  PT FREQUENCY: 1-2x/week  PT DURATION: 12 weeks  PLANNED INTERVENTIONS: Therapeutic exercises, Therapeutic activity, Neuromuscular re-education, Balance training, Gait training, Patient/Family education, Self Care, Joint mobilization, Stair training, Vestibular training, Canalith repositioning, Visual/preceptual remediation/compensation, Orthotic/Fit training, DME instructions, Dry Needling, Electrical stimulation, Spinal mobilization, Cryotherapy, Moist heat, Taping, Ultrasound, Manual therapy, and Re-evaluation  PLAN FOR NEXT SESSION: d/c   Baird Kay, PT 07/08/2023, 5:18  PM  5:18 PM, 07/08/23  Physical Therapist - [facility]  Outpatient Physical Therapy- Main Campus [phone]

## 2023-07-09 ENCOUNTER — Ambulatory Visit: Payer: 59

## 2023-07-16 ENCOUNTER — Ambulatory Visit: Payer: 59

## 2023-07-22 ENCOUNTER — Ambulatory Visit: Payer: 59

## 2023-07-23 ENCOUNTER — Ambulatory Visit: Payer: 59

## 2023-07-24 IMAGING — CR DG CHEST 2V
2 series · 2 of 2 positions shown · non-contrast
Comparison: None.

CLINICAL DATA: Right anterior chest pain for a day.

EXAM:
CHEST - 2 VIEW

[chest pa]
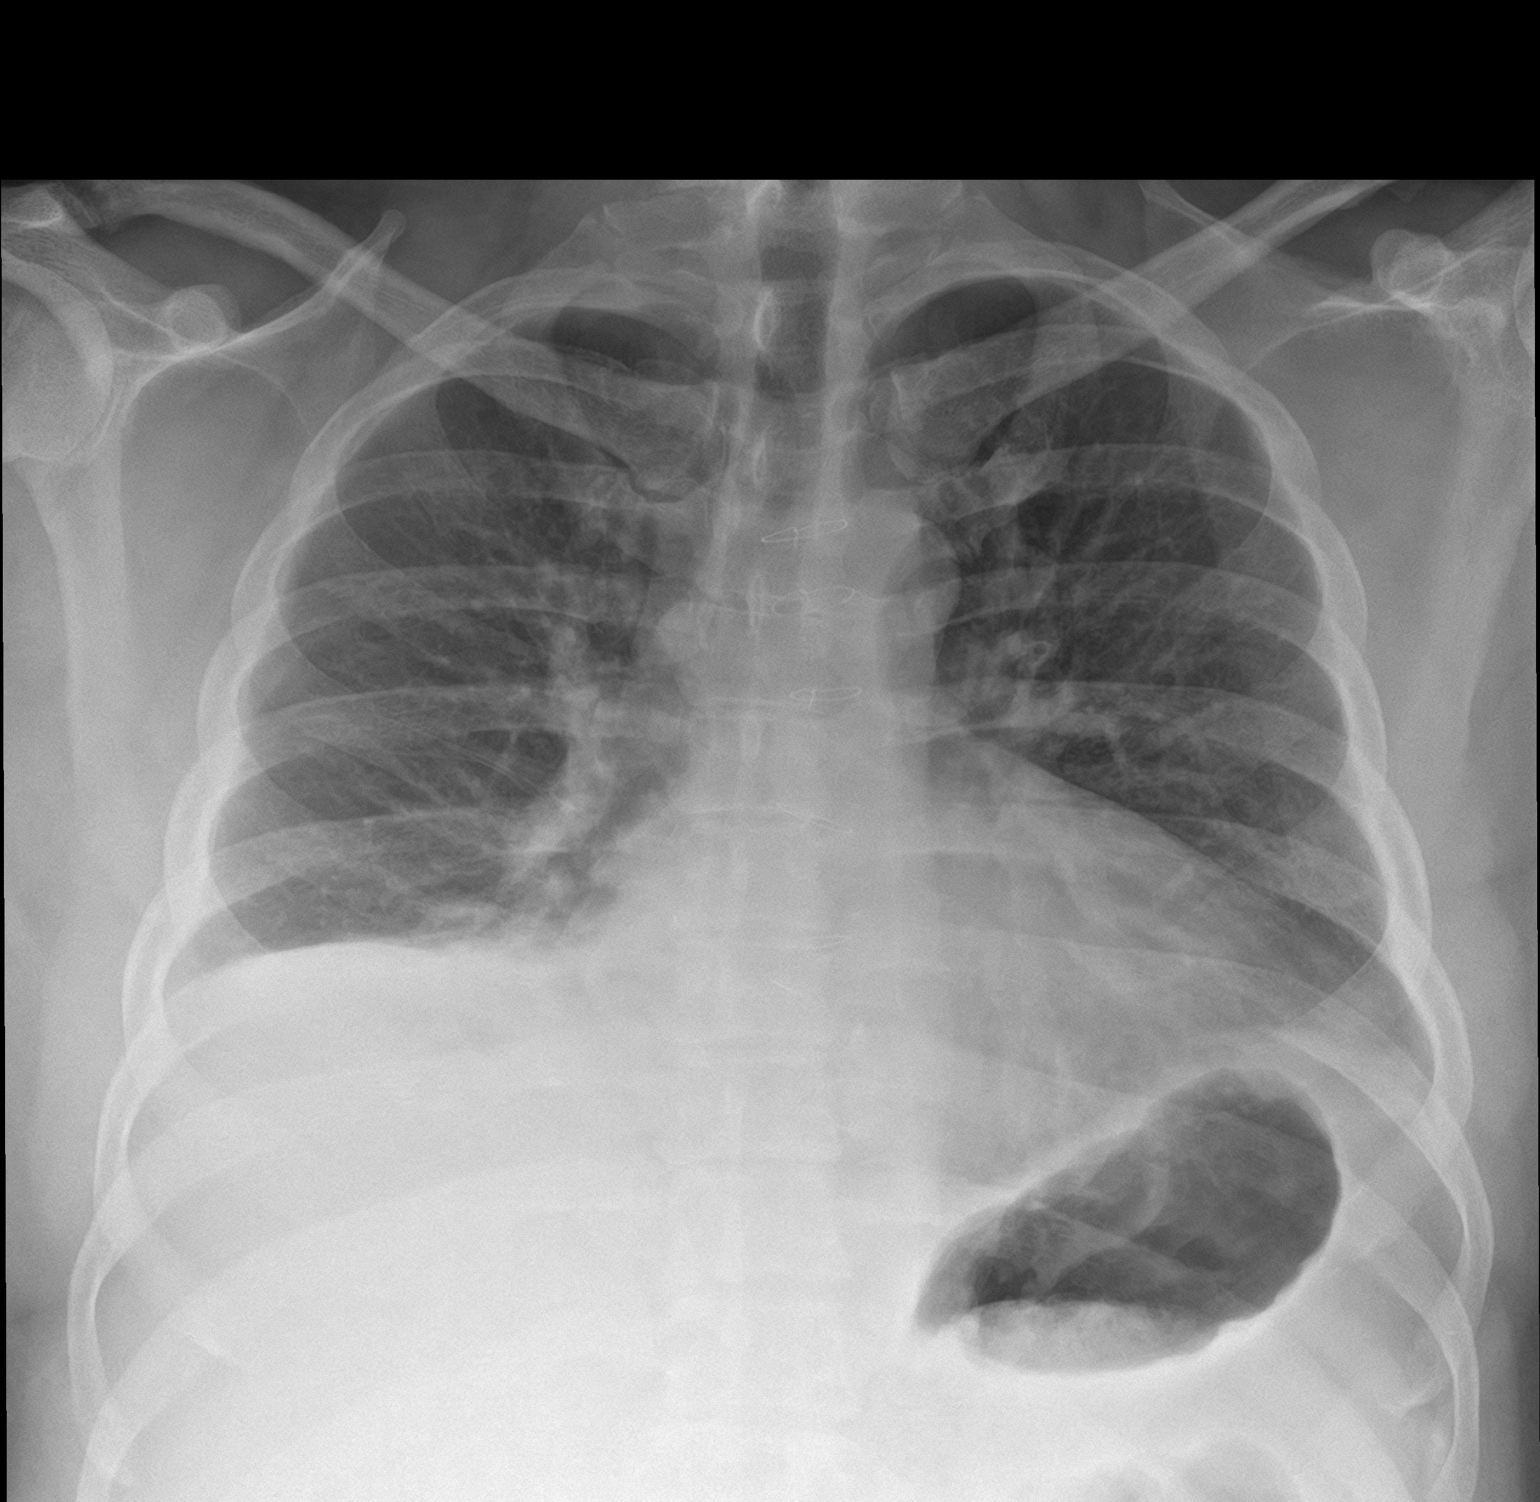

[chest lat]
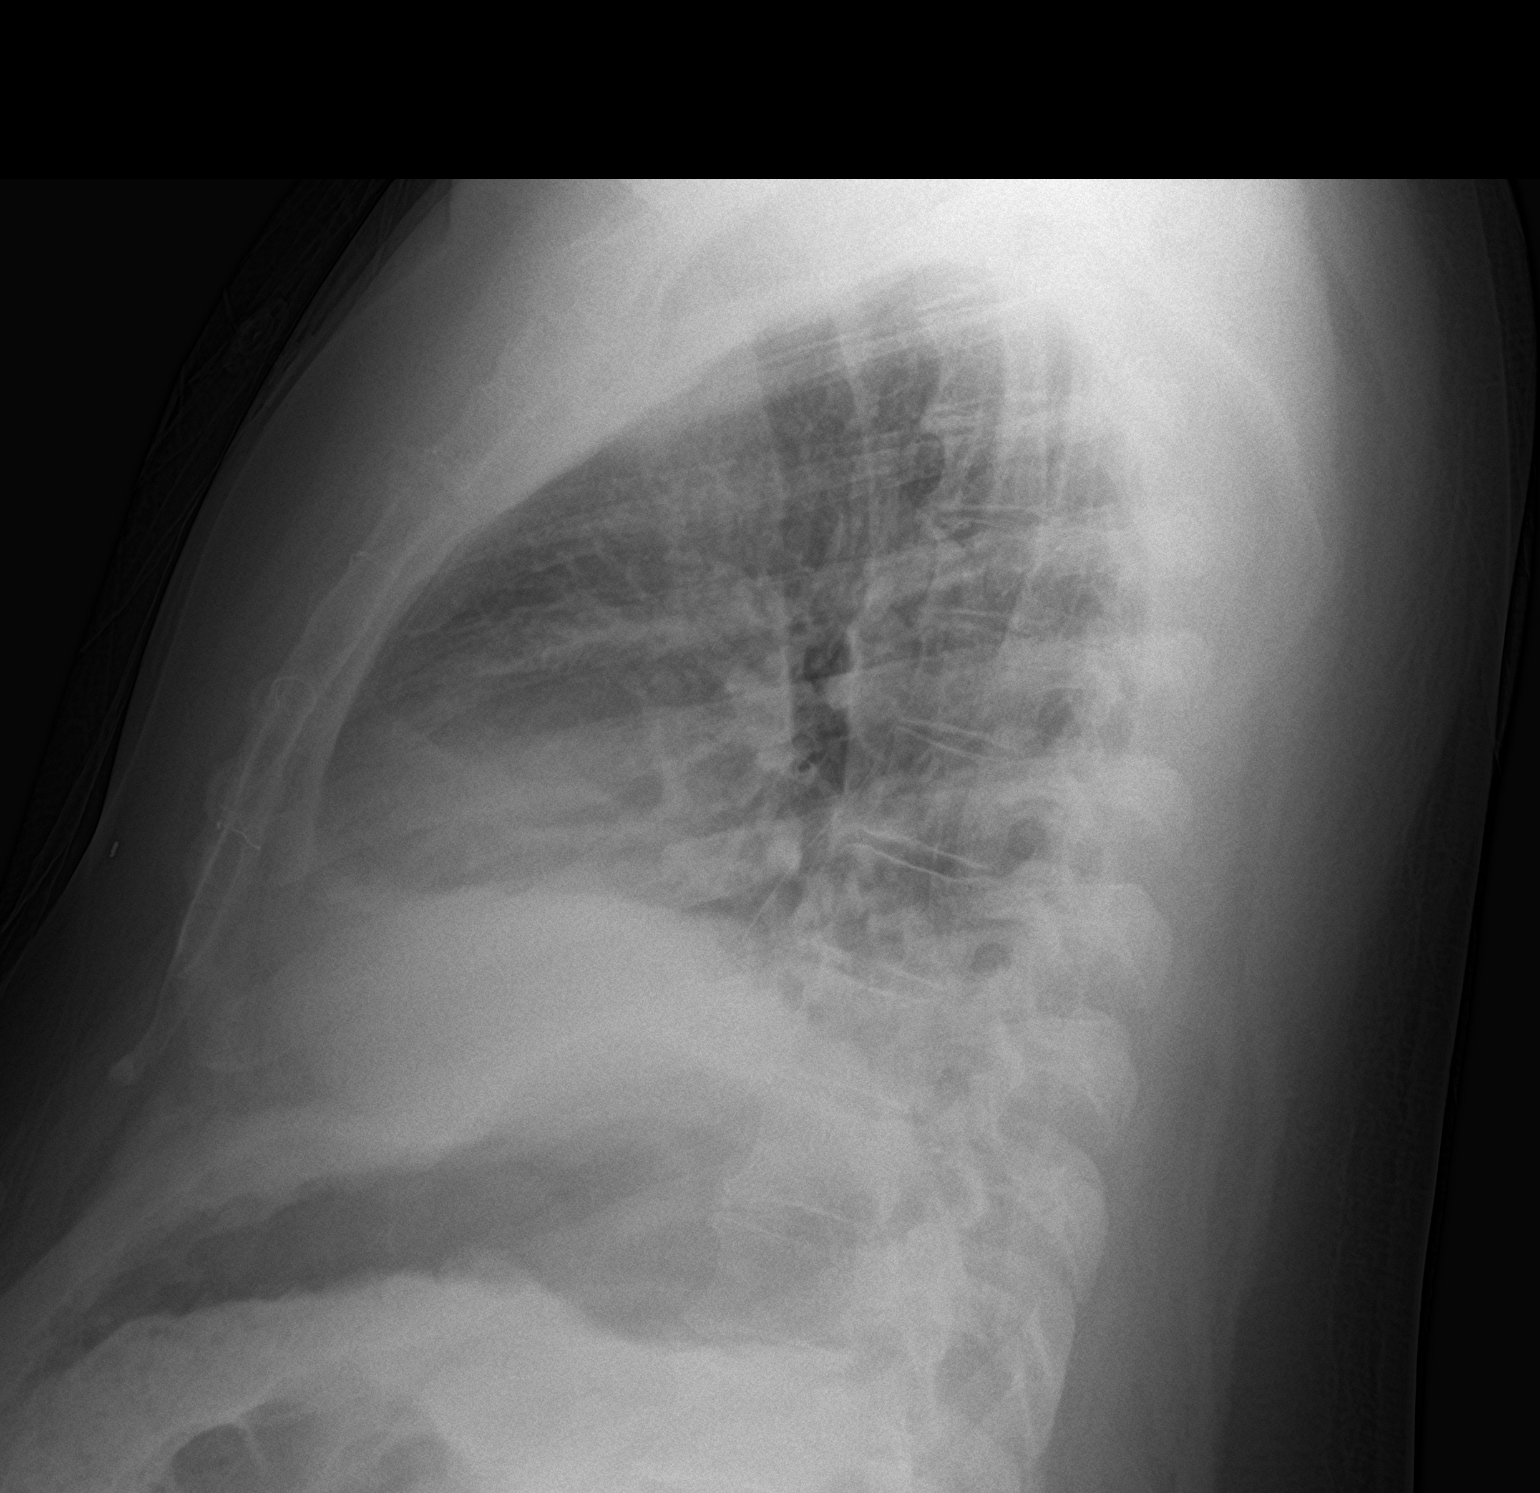

[2 of 2 positions shown; findings below may reference images not displayed]

FINDINGS: The heart, hila, mediastinum, lungs, and pleura are unremarkable. No
cause for right chest pain identified.
IMPRESSION: No active cardiopulmonary disease.

## 2023-07-30 ENCOUNTER — Ambulatory Visit: Payer: 59

## 2023-08-05 ENCOUNTER — Ambulatory Visit: Payer: 59

## 2023-08-06 ENCOUNTER — Ambulatory Visit: Payer: 59

## 2023-08-13 ENCOUNTER — Ambulatory Visit: Payer: 59

## 2023-08-19 ENCOUNTER — Ambulatory Visit: Payer: 59

## 2023-08-20 ENCOUNTER — Ambulatory Visit: Payer: 59

## 2023-09-09 ENCOUNTER — Ambulatory Visit: Payer: 59

## 2023-10-07 ENCOUNTER — Ambulatory Visit: Payer: 59

## 2023-11-04 ENCOUNTER — Ambulatory Visit: Payer: 59

## 2023-12-09 ENCOUNTER — Ambulatory Visit: Payer: 59

## 2024-01-06 ENCOUNTER — Ambulatory Visit: Payer: 59

## 2024-02-10 ENCOUNTER — Ambulatory Visit: Payer: 59

## 2024-03-09 ENCOUNTER — Ambulatory Visit: Payer: 59

## 2024-04-06 ENCOUNTER — Ambulatory Visit: Payer: 59

## 2024-05-11 ENCOUNTER — Ambulatory Visit: Payer: 59
